# Patient Record
Sex: Female | Born: 1985 | Race: Black or African American | Hispanic: No | Marital: Single | Smoking: Never smoker
Health system: Southern US, Community
[De-identification: ages and names within clinical notes are randomized; demographics above are authoritative.]

## PROBLEM LIST (undated history)

## (undated) DIAGNOSIS — I10 Essential (primary) hypertension: Secondary | ICD-10-CM

## (undated) HISTORY — PX: HERNIA REPAIR: SHX51

---

## 2003-01-30 ENCOUNTER — Inpatient Hospital Stay (HOSPITAL_COMMUNITY): Admission: AD | Admit: 2003-01-30 | Discharge: 2003-01-30 | Payer: Self-pay | Admitting: *Deleted

## 2004-07-04 ENCOUNTER — Ambulatory Visit (HOSPITAL_COMMUNITY): Admission: RE | Admit: 2004-07-04 | Discharge: 2004-07-04 | Payer: Self-pay | Admitting: Obstetrics

## 2004-12-16 ENCOUNTER — Inpatient Hospital Stay (HOSPITAL_COMMUNITY): Admission: AD | Admit: 2004-12-16 | Discharge: 2004-12-19 | Payer: Self-pay | Admitting: Obstetrics

## 2006-02-16 ENCOUNTER — Inpatient Hospital Stay (HOSPITAL_COMMUNITY): Admission: AD | Admit: 2006-02-16 | Discharge: 2006-02-16 | Payer: Self-pay | Admitting: Obstetrics

## 2006-03-08 ENCOUNTER — Ambulatory Visit (HOSPITAL_COMMUNITY): Admission: RE | Admit: 2006-03-08 | Discharge: 2006-03-08 | Payer: Self-pay | Admitting: Obstetrics

## 2006-03-12 ENCOUNTER — Inpatient Hospital Stay (HOSPITAL_COMMUNITY): Admission: AD | Admit: 2006-03-12 | Discharge: 2006-03-12 | Payer: Self-pay | Admitting: Obstetrics

## 2006-03-13 ENCOUNTER — Inpatient Hospital Stay (HOSPITAL_COMMUNITY): Admission: AD | Admit: 2006-03-13 | Discharge: 2006-03-13 | Payer: Self-pay | Admitting: Obstetrics

## 2006-03-14 ENCOUNTER — Inpatient Hospital Stay (HOSPITAL_COMMUNITY): Admission: AD | Admit: 2006-03-14 | Discharge: 2006-03-16 | Payer: Self-pay | Admitting: Obstetrics

## 2006-03-15 ENCOUNTER — Encounter (INDEPENDENT_AMBULATORY_CARE_PROVIDER_SITE_OTHER): Payer: Self-pay | Admitting: *Deleted

## 2007-08-10 ENCOUNTER — Encounter: Payer: Self-pay | Admitting: Family Medicine

## 2007-08-10 ENCOUNTER — Ambulatory Visit: Payer: Self-pay | Admitting: Internal Medicine

## 2007-08-10 LAB — CONVERTED CEMR LAB
ALT: 9 units/L (ref 0–35)
Albumin: 3.7 g/dL (ref 3.5–5.2)
BUN: 9 mg/dL (ref 6–23)
Basophils Absolute: 0 10*3/uL (ref 0.0–0.1)
Basophils Relative: 0 % (ref 0–1)
CO2: 25 meq/L (ref 19–32)
Chloride: 105 meq/L (ref 96–112)
Cholesterol: 180 mg/dL (ref 0–200)
Creatinine, Ser: 0.73 mg/dL (ref 0.40–1.20)
Eosinophils Absolute: 0.2 10*3/uL (ref 0.0–0.7)
Eosinophils Relative: 2 % (ref 0–5)
HDL: 52 mg/dL (ref >39)
Hemoglobin: 9.8 g/dL — ABNORMAL LOW (ref 12.0–15.0)
Lymphocytes Relative: 32 % (ref 12–46)
Lymphs Abs: 2.5 10*3/uL (ref 0.7–4.0)
MCV: 74.8 fL — ABNORMAL LOW (ref 78.0–100.0)
Neutrophils Relative %: 59 % (ref 43–77)
Platelets: 269 10*3/uL (ref 150–400)
Potassium: 4.3 meq/L (ref 3.5–5.3)
TSH: 4.437 microintl units/mL (ref 0.350–4.50)
Total Protein: 7.4 g/dL (ref 6.0–8.3)
Triglycerides: 48 mg/dL (ref <150)
WBC: 7.8 10*3/uL (ref 4.0–10.5)

## 2007-08-29 ENCOUNTER — Ambulatory Visit (HOSPITAL_COMMUNITY): Admission: RE | Admit: 2007-08-29 | Discharge: 2007-08-29 | Payer: Self-pay | Admitting: General Surgery

## 2010-06-17 NOTE — Op Note (Signed)
[name], [name]              ACCOUNT NO.:  [id number]   MEDICAL RECORD NO.:  [id number]          PATIENT TYPE:  AMB   LOCATION:  DAY                          FACILITY:  [facility]   PHYSICIAN:  Leonie Man, M.D.   DATE OF BIRTH:  October 03, 1985   DATE OF PROCEDURE:  08/29/2007  DATE OF DISCHARGE:                               OPERATIVE REPORT   PREOPERATIVE DIAGNOSIS:  Umbilical hernia.   POSTOPERATIVE DIAGNOSIS:  Umbilical hernia.   PROCEDURE:  Repair umbilical hernia.   SURGEON:  Leonie Man, M.D.   ASSISTANT:  OR tech.   ANESTHESIA:  General.   SPECIMENS SENT:  There were no specimens sent to lab.   BLOOD LOSS:  Minimal.   COMPLICATIONS:  None.   DISPOSITION:  The patient returned to the PACU in excellent condition.   INDICATION:  Ms. Valentine is a 25 year old obese female complaining of  severe periumbilical pain.  She has undergone a previous laparoscopy in  the past, and there is, in fact, on palpation a very small defect that  is within the depths of the umbilicus.  Superior to this there is a  bulge, which I felt represented an incarcerated hernia.  The patient  comes to the operating room now after risks and potential benefits of  surgery have been fully discussed, all questions answered and consent  for surgery obtained.   PROCEDURE IN DETAIL:  The patient is positioned supine and following  induction of satisfactory general anesthesia, the patient is identified  as Wendy Mayer.  The operation be done is an umbilical hernia repair.  All 6 precautions in the operating room were then carried out, and then  made an excision that went down into the depths of the umbilicus,  extending superiorly to the region of the bulge, through the skin,  subcutaneous tissue and dissection carried down to the fascia.  There  was, indeed, a very small defect in the umbilical midline.  This was  opened more widely so as to explore the abdominal wall in this area  inferiorly  and superiorly, laterally and distally, there were no  additional defects noted.  The umbilical hernia and the defect created  were then closed with interrupted #1 Novofil sutures.  Sponge,  instrument and sharp counts were verified.  Subcutaneous tissues closed  with 3-0 Vicryl suture running and the skin closed with interrupted 4-0  Monocryl running suture.  This was reinforced with Dermabond and Steri-  Strips.  A sterile dressing applied.  The anesthetic reversed, and the  patient removed from the operating room to the recovery room in stable  condition.  She tolerated the procedure well.      Leonie Man, M.D.  Electronically Signed     PB/MEDQ  D:  08/29/2007  T:  08/29/2007  Job:  [id number]

## 2010-06-20 NOTE — Op Note (Signed)
NAMELOVADA, BARWICK              ACCOUNT NO.:  [id number]   MEDICAL RECORD NO.:  [id number]          PATIENT TYPE:  INP   LOCATION:  9118                          FACILITY:  [facility]   PHYSICIAN:  Kathreen Cosier, M.D.DATE OF BIRTH:  [birth year]   DATE OF PROCEDURE:  03/14/2006  DATE OF DISCHARGE:                               OPERATIVE REPORT   PREOPERATIVE DIAGNOSIS:  Multiparity.   POSTOPERATIVE DIAGNOSIS:  Multiparity.   PROCEDURE:  Postpartum tubal ligation.   DESCRIPTION OF PROCEDURE:  Using spinal, with the patient in the supine  position, the abdomen was prepped and draped.  The bladder was emptied  with a straight catheter.   A midline subumbilical incision 1-inch long was made and carried down to  the fascia.  The fascia was grasped with two Kochers and the fascia of  the peritoneum opened with the Mayo scissors.  The left tube was grasped  in the midportion with a Babcock clamp.  A 0 plain suture was placed in  the mesosalpinx below the portion of tube and the clamp cut and tied and  the 1-inch of tube transected.  Hemostasis was satisfactory.  We  proceeded in a similar fashion on the other side.  The abdomen was  closed in layers, the peritoneum and fascia with continuous suture of 0  Dexon.  The skin was closed with a subcuticular stitch of 4-0 Monocryl.   The patient tolerated the procedure well and was taken to the recovery  room in good condition.           ______________________________  Kathreen Cosier, M.D.     BAM/MEDQ  D:  03/14/2006  T:  03/14/2006  Job:  [id number]

## 2010-10-31 LAB — BASIC METABOLIC PANEL
BUN: 6
CO2: 29
Chloride: 106
Creatinine, Ser: 0.77
GFR calc non Af Amer: >60
Sodium: 143

## 2010-10-31 LAB — DIFFERENTIAL
Basophils Relative: 2 — ABNORMAL HIGH
Eosinophils Absolute: 0.1
Eosinophils Relative: 1
Lymphocytes Relative: 35
Lymphs Abs: 3.8
Monocytes Absolute: 1.1 — ABNORMAL HIGH
Monocytes Relative: 10
Neutro Abs: 5.7
Neutrophils Relative %: 52

## 2010-10-31 LAB — CBC: RBC: 4.42

## 2011-03-03 ENCOUNTER — Encounter (HOSPITAL_COMMUNITY): Payer: Self-pay

## 2011-03-03 ENCOUNTER — Emergency Department (HOSPITAL_COMMUNITY)
Admission: EM | Admit: 2011-03-03 | Discharge: 2011-03-03 | Disposition: A | Discharge status: Home / Self Care | Payer: Medicaid Other | Attending: Emergency Medicine | Admitting: Emergency Medicine

## 2011-03-03 DIAGNOSIS — S61209A Unspecified open wound of unspecified finger without damage to nail, initial encounter: Secondary | ICD-10-CM | POA: Insufficient documentation

## 2011-03-03 DIAGNOSIS — S31109A Unspecified open wound of abdominal wall, unspecified quadrant without penetration into peritoneal cavity, initial encounter: Secondary | ICD-10-CM | POA: Insufficient documentation

## 2011-03-03 DIAGNOSIS — S51809A Unspecified open wound of unspecified forearm, initial encounter: Secondary | ICD-10-CM | POA: Insufficient documentation

## 2011-03-03 DIAGNOSIS — E669 Obesity, unspecified: Secondary | ICD-10-CM | POA: Insufficient documentation

## 2011-03-03 DIAGNOSIS — R109 Unspecified abdominal pain: Secondary | ICD-10-CM | POA: Insufficient documentation

## 2011-03-03 DIAGNOSIS — S31119A Laceration without foreign body of abdominal wall, unspecified quadrant without penetration into peritoneal cavity, initial encounter: Secondary | ICD-10-CM

## 2011-03-03 MED ORDER — TETANUS-DIPHTH-ACELL PERTUSSIS 5-2.5-18.5 LF-MCG/0.5 IM SUSP
0.5000 mL | Freq: Once | INTRAMUSCULAR | Status: AC
  Administered 2011-03-03: 0.5 mL via INTRAMUSCULAR
  Filled 2011-03-03: qty 0.5

## 2011-03-03 NOTE — ED Notes (Signed)
MD at bedside for suture/staple.
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
## 2011-03-26 ENCOUNTER — Emergency Department (HOSPITAL_COMMUNITY)
Admission: EM | Admit: 2011-03-26 | Discharge: 2011-03-26 | Disposition: A | Discharge status: Home / Self Care | Payer: Medicaid Other | Attending: Emergency Medicine | Admitting: Emergency Medicine

## 2011-03-26 ENCOUNTER — Encounter (HOSPITAL_COMMUNITY): Payer: Self-pay | Admitting: Emergency Medicine

## 2011-03-26 DIAGNOSIS — H9209 Otalgia, unspecified ear: Secondary | ICD-10-CM | POA: Insufficient documentation

## 2011-03-26 DIAGNOSIS — H9201 Otalgia, right ear: Secondary | ICD-10-CM

## 2011-03-26 DIAGNOSIS — J309 Allergic rhinitis, unspecified: Secondary | ICD-10-CM | POA: Insufficient documentation

## 2011-03-26 HISTORY — DX: Essential (primary) hypertension: I10

## 2011-03-26 MED ORDER — ANTIPYRINE-BENZOCAINE 5.4-1.4 % OT SOLN
3.0000 [drp] | Freq: Once | OTIC | Status: AC
  Administered 2011-03-26: 4 [drp] via OTIC
  Filled 2011-03-26: qty 10

## 2011-03-26 MED ORDER — OXYMETAZOLINE HCL 0.05 % NA SOLN: 2.0000 | Freq: Two times a day (BID) | NASAL | Status: AC

## 2011-03-26 MED ORDER — FLUTICASONE PROPIONATE 50 MCG/ACT NA SUSP: 2.0000 | Freq: Every day | NASAL | Status: DC

## 2011-03-26 NOTE — ED Provider Notes (Signed)
History     CSN: [id number]  Arrival date & time 03/26/11  1017   First MD Initiated Contact with Patient 03/26/11 1027      No chief complaint on file.   (Consider location/radiation/quality/duration/timing/severity/associated sxs/prior treatment) The history is provided by the patient.   Patient states that she has had right ear pain for the past month, which has been accompanied by headache. This is described as achy in nature, and has not seemed to change in nature since initial presentation. Headache is worst in the morning and seems to improve throughout the day. She denies difficulty hearing from the ear, tinnitus. States she has slight dizziness when the headache is severe. She has not had associated chest pain, dyspnea, abdominal pain, nausea, vomiting. There's been no photophobia or phonophobia. Denies changes in vision. She does have a history of allergic rhinitis.  No past medical history on file.  Past Surgical History  Procedure Date  . Hernia repair     No family history on file.  History  Substance Use Topics  . Smoking status: Never Smoker   . Smokeless tobacco: Not on file  . Alcohol Use: No    OB History    Grav Para Term Preterm Abortions TAB SAB Ect Mult Living                  Review of Systems  Constitutional: Negative for fever, chills, activity change and appetite change.   review of systems negative except as indicated in the history of present illness.  Allergies  Review of patient's allergies indicates no known allergies.  Home Medications  No current outpatient prescriptions on file.  BP 127/64  Pulse 77  Temp 98.6 F (37 C)  Resp 18  SpO2 100%  LMP 03/20/2011  Physical Exam  Nursing note and vitals reviewed. Constitutional: She is oriented to person, place, and time. She appears well-developed and well-nourished. No distress.  HENT:  Head: Normocephalic and atraumatic.  Right Ear: External ear normal.  Left Ear: External ear  normal.       Tympanic membranes normal bilaterally. There is no evidence for otitis externa. She is nontender to palpation of the mastoids. There is no sinus tenderness to palpation or percussion. There is no evidence of dental decay or tenderness to palpation of the teeth.  Eyes: Conjunctivae and EOM are normal. Pupils are equal, round, and reactive to light. Right eye exhibits no discharge. Left eye exhibits no discharge.       Visual Acuity  -  Bilateral Near:  20/20 ;  Bilateral Distance:  20/20 ;  R Near:  20/20 ;  R Distance:  20/20 ;  L Near:  20/20 ;  L Distance:  20/20  Neck: Normal range of motion. Neck supple.  Cardiovascular: Normal rate, regular rhythm and normal heart sounds.   Pulmonary/Chest: Effort normal and breath sounds normal. She exhibits no tenderness.  Abdominal: Soft. Bowel sounds are normal. There is no tenderness. There is no rebound and no guarding.  Musculoskeletal: Normal range of motion.  Lymphadenopathy:    She has no cervical adenopathy.  Neurological: She is alert and oriented to person, place, and time.  Skin: Skin is warm and dry. No rash noted. She is not diaphoretic.  Psychiatric: She has a normal mood and affect.    ED Course  Procedures (including critical care time)  Labs Reviewed - No data to display No results found.   1. Otalgia of right ear  2. Allergic rhinitis       MDM  Patient with otalgia of the right ear. She is nontoxic-appearing and afebrile. There are no findings on physical exam to account for her otalgia. Plan to treat her symptomatically. Will give a trial of Afrin for possible eustachian tube dysfunction. She was encouraged to make a followup with ENT if she's not improving. Patient was agreeable to this plan. Return precautions discussed.        [place], [place] 03/27/11 [phone]

## 2011-03-26 NOTE — Discharge Instructions (Signed)
Your exam today did not show an obvious cause for your pain. You have been given ear drops here to use - place several drops in the ear 3 times daily as needed. You've also been given prescriptions for 2 nose sprays: Afrin (decongestant) and Flonase (for allergies). Do not use the Afrin for more than 5 days in a row. If you're having worsening pain, high fever, or any other worrisome symptoms, please return to the ER.  RESOURCE GUIDE  Dental Problems  Patients with Medicaid: [facility] [phone] [address]                                           [phone] [address] Phone:  [phone]                                                  Phone:  [phone]  If unable to pay or uninsured, contact:  [facility] or [facility]. to become qualified for the adult dental clinic.  Chronic Pain Problems Contact [facility]  [phone] Patients need to be referred by their primary care doctor.  Insufficient Money for Medicine Contact [employer]:  call "211" or [facility] 630-670-8587.  No Primary Care Doctor Call [facility]  [phone] Other agencies that provide inexpensive medical care    [facility]  [phone]    [facility]  [phone]    [facility]  [phone]    [facility]  [phone]    [facility]  [phone]    [facility]  [phone]  Psychological Services [facility]  [phone] [facility]  [phone] [facility]   [phone] (emergency services [phone])  Substance Abuse Resources [facility]  [phone] [facility] [phone] [facility] [phone] Floydene Flock [phone] Residential & Outpatient Substance Abuse Program  782-820-9242  Abuse/Neglect [facility] Child Abuse Hotline 218-715-7897 [facility] Child Abuse Hotline [phone]  (After Hours)  Emergency Shelter [facility] [phone]  Maternity Homes Room at the [place] [facility] [phone] [facility] [phone]  MRSA Hotline #:   (808)786-9468    [facility] Resources  [facility]     [employer]                          [facility] [address] [place]                       [address]      [address]  Patrecia Pace  Michell Heinrich Phone:  [phone]                                   Phone:  [phone]                 Phone:  [phone]  [facility] Phone:  [phone]  [facility] Child Abuse Hotline 2537618953 [phone] (After Hours)  Otalgia The most common reason for this in children is an infection of the middle ear. Pain from the middle ear is usually caused by a build-up of fluid and pressure behind the eardrum. Pain from an earache can be sharp, dull, or burning. The pain may be temporary or constant. The middle ear is connected to the nasal passages by a short narrow tube called the Eustachian tube. The Eustachian tube allows fluid to drain out of the middle ear, and helps keep the pressure in your ear equalized. CAUSES  A cold or allergy can block the Eustachian tube with inflammation and the build-up of secretions. This is especially likely in small children, because their Eustachian tube is shorter and more horizontal. When the Eustachian tube closes, the normal flow of fluid from the middle ear is stopped. Fluid can accumulate and cause stuffiness, pain, hearing loss, and an ear infection if germs start growing in this area. SYMPTOMS  The symptoms of an ear infection may include fever, ear pain, fussiness, increased crying, and irritability. Many children will have temporary and minor hearing loss during and right after an ear infection.  Permanent hearing loss is rare, but the risk increases the more infections a child has. Other causes of ear pain include retained water in the outer ear canal from swimming and bathing. Ear pain in adults is less likely to be from an ear infection. Ear pain may be referred from other locations. Referred pain may be from the joint between your jaw and the skull. It may also come from a tooth problem or problems in the neck. Other causes of ear pain include:  A foreign body in the ear.   Outer ear infection.   Sinus infections.   Impacted ear wax.   Ear injury.   Arthritis of the jaw or TMJ problems.   Middle ear infection.   Tooth infections.   Sore throat with pain to the ears.  DIAGNOSIS  Your caregiver can usually make the diagnosis by examining you. Sometimes other special studies, including x-rays and lab work may be necessary. TREATMENT   If antibiotics were prescribed, use them as directed and finish them even if you or your child's symptoms seem to be improved.   Sometimes PE tubes are needed in children. These are little plastic tubes which are put into the eardrum during a simple surgical procedure. They allow fluid to drain easier and allow the pressure in the middle ear to equalize. This helps relieve the ear pain caused by pressure changes.  HOME CARE INSTRUCTIONS   Only take over-the-counter or prescription medicines for pain, discomfort, or fever as directed by your caregiver. DO NOT GIVE CHILDREN ASPIRIN because of the association of Reye's Syndrome in children taking aspirin.   Use a cold pack applied to the outer ear for 15 to 20 minutes, 3 to 4 times per day or as needed may reduce pain. Do not apply ice directly to the skin. You may cause frost bite.   Over-the-counter ear drops used as  directed may be effective. Your caregiver may sometimes prescribe ear drops.   Resting in an upright position may help reduce pressure in the middle ear and relieve pain.   Ear  pain caused by rapidly descending from high altitudes can be relieved by swallowing or chewing gum. Allowing infants to suck on a bottle during airplane travel can help.   Do not smoke in the house or near children. If you are unable to quit smoking, smoke outside.   Control allergies.  SEEK IMMEDIATE MEDICAL CARE IF:   You or your child are becoming sicker.   Pain or fever relief is not obtained with medicine.   You or your child's symptoms (pain, fever, or irritability) do not improve within 24 to 48 hours or as instructed.   Severe pain suddenly stops hurting. This may indicate a ruptured eardrum.   You or your children develop new problems such as severe headaches, stiff neck, difficulty swallowing, or swelling of the face or around the ear.  Document Released: 09/06/2003 Document Revised: 10/01/2010 Document Reviewed: 01/11/2008 [facility] Patient Information 2012 [place], [place].Headache and Allergies The relationship between allergies and headaches is unclear. Many people with allergic or infectious nasal problems also have headaches (migraines or sinus headaches). However, sometimes allergies can cause pressure that feels like a headache, and sometimes headaches can cause allergy-like symptoms. It is not always clear whether your symptoms are caused by allergies or by a headache. CAUSES   Migraine: The cause of a migraine is not always known.   Sinus Headache: The cause of a sinus headache may be a sinus infection. Other conditions that may be related to sinus headaches include:   Hay fever (allergic rhinitis).   Deviation of the nasal septum.   Swelling or clogging of the nasal passages.  SYMPTOMS  Migraine headache symptoms (which often last 4 to 72 hours) include:  Intense, throbbing pain on one or both sides of the head.   Nausea.   Vomiting.   Being extra sensitive to light.   Being extra sensitive to sound.   Nervous system reactions that appear similar to an  allergic reaction:   Stuffy nose.   Runny nose.   Tearing.  Sinus headaches are felt as facial pain or pressure.  DIAGNOSIS  Because there is some overlap in symptoms, sinus and migraine headaches are often misdiagnosed. For example, a person with migraines may also feel facial pressure. Likewise, many people with hay fever may get migraine headaches rather than sinus headaches. These migraines can be triggered by the histamine release during an allergic reaction. An antihistamine medicine can eliminate this pain. There are standard criteria that help clarify the difference between these headaches and related allergy or allergy-like symptoms. Your caregiver can use these criteria to determine the proper diagnosis and provide you the best care. TREATMENT  Migraine medicine may help people who have persistent migraine headaches even though their hay fever is controlled. For some people, anti-inflammatory treatments do not work to relieve migraines. Medicines called triptans (such as sumatriptan) can be helpful for those people. Document Released: 04/11/2003 Document Revised: 10/01/2010 Document Reviewed: 05/03/2009 [facility] Patient Information 2012 [place], [place].

## 2011-03-27 NOTE — ED Provider Notes (Signed)
Medical screening examination/treatment/procedure(s) were performed by non-physician practitioner and as supervising physician I was immediately available for consultation/collaboration.   Lyanne Co, MD 03/27/11 [phone]

## 2011-04-30 ENCOUNTER — Encounter (HOSPITAL_COMMUNITY): Payer: Self-pay | Admitting: Physical Medicine and Rehabilitation

## 2011-04-30 ENCOUNTER — Emergency Department (HOSPITAL_COMMUNITY)
Admission: EM | Admit: 2011-04-30 | Discharge: 2011-04-30 | Disposition: A | Discharge status: Home / Self Care | Payer: Medicaid Other | Attending: Emergency Medicine | Admitting: Emergency Medicine

## 2011-04-30 DIAGNOSIS — H109 Unspecified conjunctivitis: Secondary | ICD-10-CM | POA: Insufficient documentation

## 2011-04-30 DIAGNOSIS — I10 Essential (primary) hypertension: Secondary | ICD-10-CM | POA: Insufficient documentation

## 2011-04-30 MED ORDER — TETRACAINE HCL 0.5 % OP SOLN
OPHTHALMIC | Status: AC
  Administered 2011-04-30: 11:00:00
  Filled 2011-04-30: qty 2

## 2011-04-30 MED ORDER — ERYTHROMYCIN 5 MG/GM OP OINT: TOPICAL_OINTMENT | OPHTHALMIC | Status: AC

## 2011-04-30 MED ORDER — FLUORESCEIN SODIUM 1 MG OP STRP
ORAL_STRIP | OPHTHALMIC | Status: AC
  Administered 2011-04-30: 11:00:00
  Filled 2011-04-30: qty 1

## 2011-04-30 MED ORDER — ERYTHROMYCIN 5 MG/GM OP OINT
TOPICAL_OINTMENT | Freq: Once | OPHTHALMIC | Status: AC
  Administered 2011-04-30: 1 via OPHTHALMIC
  Filled 2011-04-30: qty 1

## 2011-04-30 NOTE — ED Provider Notes (Signed)
History     CSN: [id number]  Arrival date & time 04/30/11  1017   First MD Initiated Contact with Patient 04/30/11 1035      Chief Complaint  Patient presents with  . Conjunctivitis    (Consider location/radiation/quality/duration/timing/severity/associated sxs/prior treatment) HPI CC left eye pain, itching redness started yesterday, mild to moderate.  Son with same sx's.  No injury.  No vision changes. Past Medical History  Diagnosis Date  . Hypertension     Past Surgical History  Procedure Date  . Hernia repair     Family History  Problem Relation Age of Onset  . Hypertension Mother     History  Substance Use Topics  . Smoking status: Never Smoker   . Smokeless tobacco: Not on file  . Alcohol Use: No    OB History    Grav Para Term Preterm Abortions TAB SAB Ect Mult Living                  Review of Systems  Eyes: Positive for pain, redness and itching. Negative for photophobia, discharge and visual disturbance.  All other systems reviewed and are negative.    Allergies  Review of patient's allergies indicates no known allergies.  Home Medications   Current Outpatient Rx  Name Route Sig Dispense Refill  . FLUTICASONE PROPIONATE 50 MCG/ACT NA SUSP Nasal Place 2 sprays into the nose daily. 16 g 2  . ERYTHROMYCIN 5 MG/GM OP OINT  Place a 1/2 inch ribbon of ointment into the lower eyelid four times a day until resolved. 3.5 g 0    BP 119/65  Pulse 82  Temp(Src) 98.5 F (36.9 C) (Oral)  Resp 20  SpO2 99%ra wnl  Physical Exam  Nursing note and vitals reviewed. Constitutional: She appears well-developed and well-nourished.  HENT:  Head: Normocephalic and atraumatic.  Eyes: Right eye exhibits no discharge. Left eye exhibits no discharge. Left conjunctiva is injected.  Slit lamp exam:      The left eye shows no corneal abrasion, no corneal flare, no corneal ulcer, no foreign body, no hyphema, no hypopyon and no fluorescein uptake.  Neurological:  She is alert. GCS eye subscore is 4. GCS verbal subscore is 5. GCS motor subscore is 6.  Skin: Skin is warm and dry.  Psychiatric: She has a normal mood and affect. Her behavior is normal.    ED Course  Procedures (including critical care time)  Labs Reviewed - No data to display No results found.   1. Conjunctivitis       MDM  Pt here with mild redness and irritation of left eye, son has bilat conjunctivitis.  Fluorescein stain neg, slit lamp nl, visual acuity ok.  Will d/c with erythromycin ointment, return warnings given        Elijio Miles, MD 04/30/11 1153

## 2011-04-30 NOTE — ED Notes (Signed)
Pt presents to department for evaluation of L eye redness and irritation. Onset this morning. States yellow colored drainage. States son recently had conjunctivitis. Eye noted to be red and watery upon arrival to ED.

## 2011-04-30 NOTE — Discharge Instructions (Signed)
Conjunctivitis Conjunctivitis is commonly called "pink eye." Conjunctivitis can be caused by bacterial or viral infection, allergies, or injuries. There is usually redness of the lining of the eye, itching, discomfort, and sometimes discharge. There may be deposits of matter along the eyelids. A viral infection usually causes a watery discharge, while a bacterial infection causes a yellowish, thick discharge. Pink eye is very contagious and spreads by direct contact. You may be given antibiotic eyedrops as part of your treatment. Before using your eye medicine, remove all drainage from the eye by washing gently with warm water and cotton balls. Continue to use the medication until you have awakened 2 mornings in a row without discharge from the eye. Do not rub your eye. This increases the irritation and helps spread infection. Use separate towels from other household members. Wash your hands with soap and water before and after touching your eyes. Use cold compresses to reduce pain and sunglasses to relieve irritation from light. Do not wear contact lenses or wear eye makeup until the infection is gone. SEEK MEDICAL CARE IF:   Your symptoms are not better after 3 days of treatment.   You have increased pain or trouble seeing.   The outer eyelids become very red or swollen.  Document Released: 02/27/2004 Document Revised: 01/08/2011 Document Reviewed: 01/19/2005 Exit[employer] Patient Information 2012 [employer].  Return for any new or worsening symptoms or any other concerns.  

## 2011-04-30 NOTE — ED Provider Notes (Signed)
I saw and evaluated the patient, reviewed the resident's note and I agree with the findings and plan.  L eye itching and redness x 2 days.  No visual change.  Son has conjuncitivits.  PERRLA, EOMI, mild conjunctival injection.  Glynn Octave, MD 04/30/11 [phone]

## 2012-08-16 ENCOUNTER — Ambulatory Visit (INDEPENDENT_AMBULATORY_CARE_PROVIDER_SITE_OTHER): Payer: Medicaid Other | Admitting: Obstetrics

## 2012-08-16 ENCOUNTER — Encounter: Payer: Self-pay | Admitting: Obstetrics

## 2012-08-16 VITALS — BP 129/86 | HR 73 | Ht 66.0 in | Wt 302.0 lb

## 2012-08-16 DIAGNOSIS — Z0000 Encounter for general adult medical examination without abnormal findings: Secondary | ICD-10-CM

## 2012-08-16 NOTE — Progress Notes (Signed)
Subjective:     Wendy Mayer is a 27 y.o. female here for a routine exam.  Current complaints: an annual exam. Pt states she has been having daily headaches. Pt states she has been taking Goody powder and it helps.   Personal health questionnaire reviewed: yes.   Gynecologic History Patient's last menstrual period was 08/04/2012. Contraception: tubal ligation Last Pap: 2012. Results were: normal Last mammogram: n/a. Results were: n/a  Obstetric History OB History   Grav Para Term Preterm Abortions TAB SAB Ect Mult Living                   The following portions of the patient's history were reviewed and updated as appropriate: allergies, current medications, past family history, past medical history, past social history, past surgical history and problem list.  Review of Systems Pertinent items are noted in HPI.    Objective:    General appearance: alert, no distress and morbidly obese Breasts: normal appearance, no masses or tenderness Abdomen: normal findings: soft, non-tender Pelvic: cervix normal in appearance, external genitalia normal, no adnexal masses or tenderness, no cervical motion tenderness, uterus normal size, shape, and consistency and vagina normal without discharge    Assessment:    Healthy female exam.    Plan:    Education reviewed: low fat, low cholesterol diet, safe sex/STD prevention, self breast exams and weight bearing exercise. Contraception: tubal ligation. Follow up in: 1 year.

## 2012-08-16 NOTE — Addendum Note (Signed)
Addended by: George Hugh on: 08/16/2012 04:06 PM   Modules accepted: Orders

## 2012-08-17 LAB — WET PREP BY MOLECULAR PROBE: Trichomonas vaginosis: NEGATIVE

## 2012-08-17 LAB — GC/CHLAMYDIA PROBE AMP: CT Probe RNA: NEGATIVE

## 2012-08-17 LAB — PAP IG W/ RFLX HPV ASCU

## 2012-08-19 ENCOUNTER — Other Ambulatory Visit: Payer: Self-pay | Admitting: Obstetrics

## 2012-08-19 MED ORDER — METRONIDAZOLE 500 MG PO TABS: 500.0000 mg | ORAL_TABLET | Freq: Two times a day (BID) | ORAL | Status: DC

## 2012-11-27 ENCOUNTER — Encounter (HOSPITAL_COMMUNITY): Payer: Self-pay | Admitting: Emergency Medicine

## 2012-11-27 ENCOUNTER — Emergency Department (HOSPITAL_COMMUNITY)
Admission: EM | Admit: 2012-11-27 | Discharge: 2012-11-27 | Disposition: A | Discharge status: Home / Self Care | Payer: Medicaid Other | Attending: Emergency Medicine | Admitting: Emergency Medicine

## 2012-11-27 DIAGNOSIS — L853 Xerosis cutis: Secondary | ICD-10-CM

## 2012-11-27 DIAGNOSIS — I10 Essential (primary) hypertension: Secondary | ICD-10-CM | POA: Insufficient documentation

## 2012-11-27 DIAGNOSIS — B352 Tinea manuum: Secondary | ICD-10-CM

## 2012-11-27 DIAGNOSIS — L989 Disorder of the skin and subcutaneous tissue, unspecified: Secondary | ICD-10-CM | POA: Insufficient documentation

## 2012-11-27 MED ORDER — TERBINAFINE HCL 1 % EX CREA: TOPICAL_CREAM | Freq: Two times a day (BID) | CUTANEOUS | Status: DC

## 2012-11-27 NOTE — ED Provider Notes (Signed)
CSN: [id number]     Arrival date & time 11/27/12  2019 History   None    This chart was scribed for non-physician practitioner, Ivonne Andrew PA-C,  working with Toy Baker, MD by Arlan Organ, ED Scribe. This patient was seen in room WTR7/WTR7 and the patient's care was started at 10:03 PM.   Chief Complaint  Patient presents with  . Rash   The history is provided by the patient. No language interpreter was used.   HPI Comments: Wendy Mayer is a 27 y.o. female who presents to the Emergency Department complaining of a unchanged bilateral peeling rash to bilateral hands that started a week ago. Pt states she has tried vaseline and lotion with no relief. Pt denies touching any new detergents, soups, or chemicals. Pt denies any known skin conditions. Pt has a hx of HTN but no other medical study. She denies any other symptoms. No other rash of the skin no breathing changes. No urinary changes. No menstrual changes. No weight loss or night sweats. No dry mouth or dry eyes.    Past Medical History  Diagnosis Date  . Hypertension    Past Surgical History  Procedure Laterality Date  . Hernia repair     Family History  Problem Relation Age of Onset  . Hypertension Mother   . Cancer Father    History  Substance Use Topics  . Smoking status: Never Smoker   . Smokeless tobacco: Not on file  . Alcohol Use: No   OB History   Grav Para Term Preterm Abortions TAB SAB Ect Mult Living                 Review of Systems  Skin: Positive for rash.  All other systems reviewed and are negative.    Allergies  Review of patient's allergies indicates no known allergies.  Home Medications  No current outpatient prescriptions on file.  BP 134/79  Pulse 86  Temp(Src) 98.4 F (36.9 C) (Oral)  Resp 18  Ht 5\' 7"  (1.702 m)  Wt 289 lb (131.09 kg)  BMI 45.25 kg/m2  SpO2 100%  Physical Exam  Nursing note and vitals reviewed. Constitutional: She is oriented to person, place, and  time. She appears well-developed and well-nourished.  HENT:  Head: Normocephalic and atraumatic.  Eyes: EOM are normal.  Neck: Normal range of motion.  Cardiovascular: Normal rate.   Pulmonary/Chest: Effort normal.  Musculoskeletal: Normal range of motion.  Neurological: She is alert and oriented to person, place, and time.  Skin: Skin is warm and dry.  Dry and peeling skin to web spacing of hands  Psychiatric: She has a normal mood and affect. Her behavior is normal.    ED Course  Procedures   DIAGNOSTIC STUDIES: Oxygen Saturation is 100% on RA, Normal by my interpretation.    COORDINATION OF CARE: 10:03 PM- Will give topical medication. Discussed treatment plan with pt at bedside and pt agreed to plan.       MDM   1. Dry skin   2. Tinea manus      I personally performed the services described in this documentation, which was scribed in my presence. The recorded information has been reviewed and is accurate.   Angus Seller, PA-C 11/28/12 [phone]

## 2012-11-27 NOTE — ED Notes (Signed)
Pt c/o peeling to bilateral hands for one week.

## 2012-11-30 NOTE — ED Provider Notes (Signed)
Medical screening examination/treatment/procedure(s) were performed by non-physician practitioner and as supervising physician I was immediately available for consultation/collaboration.   Marik Sedore T Mayrani Khamis, MD 11/30/12 1332 

## 2012-12-28 ENCOUNTER — Emergency Department (HOSPITAL_COMMUNITY): Payer: Medicaid Other

## 2012-12-28 ENCOUNTER — Encounter (HOSPITAL_COMMUNITY): Payer: Self-pay | Admitting: Emergency Medicine

## 2012-12-28 ENCOUNTER — Emergency Department (HOSPITAL_COMMUNITY)
Admission: EM | Admit: 2012-12-28 | Discharge: 2012-12-28 | Disposition: A | Discharge status: Home / Self Care | Payer: Medicaid Other | Attending: Emergency Medicine | Admitting: Emergency Medicine

## 2012-12-28 DIAGNOSIS — S01502A Unspecified open wound of oral cavity, initial encounter: Secondary | ICD-10-CM | POA: Insufficient documentation

## 2012-12-28 DIAGNOSIS — S022XXA Fracture of nasal bones, initial encounter for closed fracture: Secondary | ICD-10-CM | POA: Insufficient documentation

## 2012-12-28 DIAGNOSIS — S0990XA Unspecified injury of head, initial encounter: Secondary | ICD-10-CM | POA: Insufficient documentation

## 2012-12-28 DIAGNOSIS — I10 Essential (primary) hypertension: Secondary | ICD-10-CM | POA: Insufficient documentation

## 2012-12-28 DIAGNOSIS — S0120XA Unspecified open wound of nose, initial encounter: Secondary | ICD-10-CM | POA: Insufficient documentation

## 2012-12-28 DIAGNOSIS — Z23 Encounter for immunization: Secondary | ICD-10-CM | POA: Insufficient documentation

## 2012-12-28 MED ORDER — TETANUS-DIPHTH-ACELL PERTUSSIS 5-2.5-18.5 LF-MCG/0.5 IM SUSP
0.5000 mL | Freq: Once | INTRAMUSCULAR | Status: AC
  Administered 2012-12-28: 0.5 mL via INTRAMUSCULAR
  Filled 2012-12-28: qty 0.5

## 2012-12-28 MED ORDER — IBUPROFEN 800 MG PO TABS
800.0000 mg | ORAL_TABLET | Freq: Once | ORAL | Status: AC
  Administered 2012-12-28: 800 mg via ORAL
  Filled 2012-12-28: qty 1

## 2012-12-28 NOTE — ED Provider Notes (Signed)
Medical screening examination/treatment/procedure(s) were performed by non-physician practitioner and as supervising physician I was immediately available for consultation/collaboration.  EKG Interpretation   None         Candyce Churn, MD 12/28/12 2033
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
## 2012-12-28 NOTE — ED Notes (Signed)
[employer] at bedside 

## 2013-07-21 ENCOUNTER — Encounter (HOSPITAL_COMMUNITY): Payer: Self-pay | Admitting: Emergency Medicine

## 2013-07-21 ENCOUNTER — Emergency Department (HOSPITAL_COMMUNITY)
Admission: EM | Admit: 2013-07-21 | Discharge: 2013-07-21 | Disposition: A | Discharge status: Home / Self Care | Payer: Medicaid Other | Attending: Emergency Medicine | Admitting: Emergency Medicine

## 2013-07-21 DIAGNOSIS — I10 Essential (primary) hypertension: Secondary | ICD-10-CM | POA: Insufficient documentation

## 2013-07-21 DIAGNOSIS — H9209 Otalgia, unspecified ear: Secondary | ICD-10-CM | POA: Insufficient documentation

## 2013-07-21 DIAGNOSIS — J069 Acute upper respiratory infection, unspecified: Secondary | ICD-10-CM

## 2013-07-21 LAB — RAPID STREP SCREEN (MED CTR MEBANE ONLY): STREPTOCOCCUS, GROUP A SCREEN (DIRECT): NEGATIVE

## 2013-07-21 MED ORDER — GUAIFENESIN 100 MG/5ML PO LIQD: 100.0000 mg | ORAL | Status: DC | PRN

## 2013-07-21 MED ORDER — MENTHOL 5.4 MG MT LOZG
1.0000 | LOZENGE | Freq: Four times a day (QID) | OROMUCOSAL | Status: DC | PRN
Start: 2013-07-21 — End: 2013-08-16

## 2013-07-21 NOTE — Discharge Instructions (Signed)
Upper Respiratory Infection, Adult An upper respiratory infection (URI) is also sometimes known as the common cold. The upper respiratory tract includes the nose, sinuses, throat, trachea, and bronchi. Bronchi are the airways leading to the lungs. Most people improve within 1 week, but symptoms can last up to 2 weeks. A residual cough may last even longer.  CAUSES Many different viruses can infect the tissues lining the upper respiratory tract. The tissues become irritated and inflamed and often become very moist. Mucus production is also common. A cold is contagious. You can easily spread the virus to others by oral contact. This includes kissing, sharing a glass, coughing, or sneezing. Touching your mouth or nose and then touching a surface, which is then touched by another person, can also spread the virus. SYMPTOMS  Symptoms typically develop 1 to 3 days after you come in contact with a cold virus. Symptoms vary from person to person. They may include:  Runny nose.  Sneezing.  Nasal congestion.  Sinus irritation.  Sore throat.  Loss of voice (laryngitis).  Cough.  Fatigue.  Muscle aches.  Loss of appetite.  Headache.  Low-grade fever. DIAGNOSIS  You might diagnose your own cold based on familiar symptoms, since most people get a cold 2 to 3 times a year. Your caregiver can confirm this based on your exam. Most importantly, your caregiver can check that your symptoms are not due to another disease such as strep throat, sinusitis, pneumonia, asthma, or epiglottitis. Blood tests, throat tests, and X-rays are not necessary to diagnose a common cold, but they may sometimes be helpful in excluding other more serious diseases. Your caregiver will decide if any further tests are required. RISKS AND COMPLICATIONS  You may be at risk for a more severe case of the common cold if you smoke cigarettes, have chronic heart disease (such as heart failure) or lung disease (such as asthma), or if  you have a weakened immune system. The very young and very old are also at risk for more serious infections. Bacterial sinusitis, middle ear infections, and bacterial pneumonia can complicate the common cold. The common cold can worsen asthma and chronic obstructive pulmonary disease (COPD). Sometimes, these complications can require emergency medical care and may be life-threatening. PREVENTION  The best way to protect against getting a cold is to practice good hygiene. Avoid oral or hand contact with people with cold symptoms. Wash your hands often if contact occurs. There is no clear evidence that vitamin C, vitamin E, echinacea, or exercise reduces the chance of developing a cold. However, it is always recommended to get plenty of rest and practice good nutrition. TREATMENT  Treatment is directed at relieving symptoms. There is no cure. Antibiotics are not effective, because the infection is caused by a virus, not by bacteria. Treatment may include:  Increased fluid intake. Sports drinks offer valuable electrolytes, sugars, and fluids.  Breathing heated mist or steam (vaporizer or shower).  Eating chicken soup or other clear broths, and maintaining good nutrition.  Getting plenty of rest.  Using gargles or lozenges for comfort.  Controlling fevers with ibuprofen or acetaminophen as directed by your caregiver.  Increasing usage of your inhaler if you have asthma. Zinc gel and zinc lozenges, taken in the first 24 hours of the common cold, can shorten the duration and lessen the severity of symptoms. Pain medicines may help with fever, muscle aches, and throat pain. A variety of non-prescription medicines are available to treat congestion and runny nose. Your caregiver   can make recommendations and may suggest nasal or lung inhalers for other symptoms.  HOME CARE INSTRUCTIONS   Only take over-the-counter or prescription medicines for pain, discomfort, or fever as directed by your  caregiver.  Use a warm mist humidifier or inhale steam from a shower to increase air moisture. This may keep secretions moist and make it easier to breathe.  Drink enough water and fluids to keep your urine clear or pale yellow.  Rest as needed.  Return to work when your temperature has returned to normal or as your caregiver advises. You may need to stay home longer to avoid infecting others. You can also use a face mask and careful hand washing to prevent spread of the virus. SEEK MEDICAL CARE IF:   After the first few days, you feel you are getting worse rather than better.  You need your caregiver's advice about medicines to control symptoms.  You develop chills, worsening shortness of breath, or brown or red sputum. These may be signs of pneumonia.  You develop yellow or brown nasal discharge or pain in the face, especially when you bend forward. These may be signs of sinusitis.  You develop a fever, swollen neck glands, pain with swallowing, or white areas in the back of your throat. These may be signs of strep throat. SEEK IMMEDIATE MEDICAL CARE IF:   You have a fever.  You develop severe or persistent headache, ear pain, sinus pain, or chest pain.  You develop wheezing, a prolonged cough, cough up blood, or have a change in your usual mucus (if you have chronic lung disease).  You develop sore muscles or a stiff neck. Document Released: 07/15/2000 Document Revised: 04/13/2011 Document Reviewed: 05/23/2010 [employer] Patient Information 2015 [employer]. This information is not intended to replace advice given to you by your health care provider. Make sure you discuss any questions you have with your health care provider.  

## 2013-07-21 NOTE — ED Notes (Signed)
Pt c/o sore throat x 2 days. Pt states she is having painful swallowing . Pt alert, no acute distress. Denies other symptoms.

## 2013-07-21 NOTE — ED Provider Notes (Signed)
CSN: [id number]     Arrival date & time 07/21/13  1633 History  This chart was scribed for Fayrene HelperBowie Tran PA- C, working with Hilario Quarryanielle S Ray, MD, by Gwenevere AbbotAlexis Brown ED Scribe. This patient was seen in room WTR9/WTR9 and the patient's care was started at 5:15PM.   Chief Complaint  Patient presents with  . Sore Throat   The history is provided by the patient. No language interpreter was used.   HPI Comments: Wendy Mayer is a 28 y.o. female who presents to the Emergency Department complaining of a sore throat for the last two days. Pain is constant and moderate, and made worse by swallowing. Pt reports associated symptoms of ear pain, congestion, cough, rhinorrhea, and sneezing. She also states that she had one episode of emesis yesterday. She further reports an associated fever of 102 degrees on Wednesday.Pt has taken Therflu and Nyquil but this has not modified pain. Pt denies any sick contacts. Pt denies any allergies to medication. Pt denies CP, SOB, abdominal pain, and diarrhea.   Past Medical History  Diagnosis Date  . Hypertension    Past Surgical History  Procedure Laterality Date  . Hernia repair     Family History  Problem Relation Age of Onset  . Hypertension Mother   . Cancer Father    History  Substance Use Topics  . Smoking status: Never Smoker   . Smokeless tobacco: Not on file  . Alcohol Use: No   OB History   Grav Para Term Preterm Abortions TAB SAB Ect Mult Living                 Review of Systems  Constitutional: Positive for fever.  HENT: Positive for congestion, rhinorrhea and sore throat.   Respiratory: Positive for cough.   Gastrointestinal: Negative for nausea and diarrhea.    Allergies  Review of patient's allergies indicates no known allergies.  Home Medications   Prior to Admission medications   Not on File   Triage Vitals: BP 149/83  Pulse 98  Temp(Src) 98.4 F (36.9 C) (Oral)  Resp 18  SpO2 98%  Physical Exam  Nursing note and vitals  reviewed. Constitutional: She is oriented to person, place, and time. She appears well-developed and well-nourished. No distress.  HENT:  Head: Normocephalic and atraumatic.  Right Ear: Tympanic membrane, external ear and ear canal normal.  Left Ear: Tympanic membrane, external ear and ear canal normal.  Rhinorrhea noted. Uvula midline. Bilateral tonsil enlargement, with exudate. No trismus. No evidence of deep tissue infection  Eyes: Conjunctivae and EOM are normal.  Neck: Neck supple. No tracheal deviation present.  Anterior cervical chain adenopathy noted  Cardiovascular: Normal rate, regular rhythm and normal heart sounds.   Pulmonary/Chest: Effort normal. No respiratory distress.  Musculoskeletal: Normal range of motion.  Lymphadenopathy:    She has cervical adenopathy.  Neurological: She is alert and oriented to person, place, and time.  Skin: Skin is warm and dry.  Psychiatric: She has a normal mood and affect. Her behavior is normal.    ED Course  Procedures (including critical care time) DIAGNOSTIC STUDIES: Oxygen Saturation is 98% on RA, normal by my interpretation.    COORDINATION OF CARE: 5:20 PM-Discussed treatment plan which includes test for strep throat and pt agreed to plan. I advised Pt. if strep is ruled out, it is a viral infection that must run its course.     Labs Review Labs Reviewed  RAPID STREP SCREEN    Imaging  Review No results found.   EKG Interpretation None      MDM   Final diagnoses:  URI (upper respiratory infection)   BP 149/83  Pulse 98  Temp(Src) 98.4 F (36.9 C) (Oral)  Resp 18  SpO2 98%  I personally performed the services described in this documentation, which was scribed in my presence. The recorded information has been reviewed and is accurate.      Fayrene HelperBowie Tran, PA-C 07/21/13 1807

## 2013-07-21 NOTE — ED Provider Notes (Signed)
History/physical exam/procedure(s) were performed by non-physician practitioner and as supervising physician I was immediately available for consultation/collaboration. I have reviewed all notes and am in agreement with care and plan.   Danielle S Ray, MD 07/21/13 2330 

## 2013-07-23 LAB — CULTURE, GROUP A STREP

## 2013-08-16 ENCOUNTER — Encounter: Payer: Self-pay | Admitting: Obstetrics

## 2013-08-16 ENCOUNTER — Ambulatory Visit (INDEPENDENT_AMBULATORY_CARE_PROVIDER_SITE_OTHER): Payer: Medicaid Other | Admitting: Obstetrics

## 2013-08-16 VITALS — BP 130/92 | HR 170 | Temp 98.1°F | Ht 67.0 in | Wt 304.0 lb

## 2013-08-16 DIAGNOSIS — Z01419 Encounter for gynecological examination (general) (routine) without abnormal findings: Secondary | ICD-10-CM

## 2013-08-18 NOTE — Progress Notes (Signed)
Patient cancelled visit. Rescheduled for 09-26-13.

## 2013-09-26 ENCOUNTER — Ambulatory Visit: Payer: Medicaid Other | Admitting: Obstetrics

## 2013-10-25 ENCOUNTER — Ambulatory Visit: Payer: Medicaid Other | Admitting: Obstetrics

## 2013-12-04 ENCOUNTER — Ambulatory Visit (INDEPENDENT_AMBULATORY_CARE_PROVIDER_SITE_OTHER): Payer: Medicaid Other | Admitting: Obstetrics

## 2013-12-04 ENCOUNTER — Encounter: Payer: Self-pay | Admitting: Obstetrics

## 2013-12-04 VITALS — BP 114/73 | HR 71 | Temp 98.2°F | Ht 68.0 in | Wt 307.0 lb

## 2013-12-04 DIAGNOSIS — Z0000 Encounter for general adult medical examination without abnormal findings: Secondary | ICD-10-CM

## 2013-12-04 DIAGNOSIS — R52 Pain, unspecified: Secondary | ICD-10-CM | POA: Insufficient documentation

## 2013-12-04 MED ORDER — IBUPROFEN 800 MG PO TABS: 800.0000 mg | ORAL_TABLET | Freq: Three times a day (TID) | ORAL | Status: DC | PRN

## 2013-12-04 NOTE — Progress Notes (Signed)
Subjective:     Wendy Mayer is a 28 y.o. female here for a routine exam.  Current complaints: Knee pain, bilateral.    Personal health questionnaire:  Is patient [name], have a family history of breast and/or ovarian cancer: no Is there a family history of uterine cancer diagnosed at age < 5850, gastrointestinal cancer, urinary tract cancer, family member who is a Personnel officerLynch syndrome-associated carrier: no Is the patient overweight and hypertensive, family history of diabetes, personal history of gestational diabetes or PCOS: no Is patient over 2555, have PCOS,  family history of premature CHD under age 30, diabetes, smoke, have hypertension or peripheral artery disease:  no At any time, has a partner hit, kicked or otherwise hurt or frightened you?: no Over the past 2 weeks, have you felt down, depressed or hopeless?: no Over the past 2 weeks, have you felt little interest or pleasure in doing things?:no   Gynecologic History Patient's last menstrual period was 11/27/2013 (exact date). Contraception: tubal ligation Last Pap: 2014. Results were: normal Last mammogram: n/a. Results were: n/a  Obstetric History OB History  Gravida Para Term Preterm AB SAB TAB Ectopic Multiple Living  3 3 3       3     # Outcome Date GA Lbr Len/2nd Weight Sex Delivery Anes PTL Lv  3 Term      Vag-Spont None  Y  2 Term      Vag-Spont EPI  Y  1 Term      Vag-Spont EPI  Y      Past Medical History  Diagnosis Date  . Hypertension     Past Surgical History  Procedure Laterality Date  . Hernia repair      Current outpatient prescriptions: ibuprofen (ADVIL,MOTRIN) 800 MG tablet, Take 1 tablet (800 mg total) by mouth every 8 (eight) hours as needed., Disp: 30 tablet, Rfl: 5 No Known Allergies  History  Substance Use Topics  . Smoking status: Never Smoker   . Smokeless tobacco: Never Used  . Alcohol Use: 1.2 oz/week    2 Not specified per week    Family History  Problem Relation Age of  Onset  . Hypertension Mother   . Cancer Father       Review of Systems  Constitutional: negative for fatigue and weight loss Respiratory: negative for cough and wheezing Cardiovascular: negative for chest pain, fatigue and palpitations Gastrointestinal: negative for abdominal pain and change in bowel habits Musculoskeletal: positive for myalgias, knees Neurological: negative for gait problems and tremors Behavioral/Psych: negative for abusive relationship, depression Endocrine: negative for temperature intolerance   Genitourinary:negative for abnormal menstrual periods, genital lesions, hot flashes, sexual problems and vaginal discharge Integument/breast: negative for breast lump, breast tenderness, nipple discharge and skin lesion(s)    Objective:       BP 114/73 mmHg  Pulse 71  Temp(Src) 98.2 F (36.8 C)  Ht 5\' 8"  (1.727 m)  Wt 307 lb (139.254 kg)  BMI 46.69 kg/m2  LMP 11/27/2013 (Exact Date) General:   alert  Skin:   no rash or abnormalities  Lungs:   clear to auscultation bilaterally  Heart:   regular rate and rhythm, S1, S2 normal, no murmur, click, rub or gallop  Breasts:   normal without suspicious masses, skin or nipple changes or axillary nodes  Abdomen:  normal findings: no organomegaly, soft, non-tender and no hernia  Pelvis:  External genitalia: normal general appearance Urinary system: urethral meatus normal and bladder without fullness, nontender Vaginal: normal  without tenderness, induration or masses Cervix: normal appearance Adnexa: normal bimanual exam Uterus: anteverted and non-tender, normal size   Lab Review Urine pregnancy test Labs reviewed yes Radiologic studies reviewed no    Assessment:    Healthy female exam.    Knee pain, chronic.   Plan:   Referred to Orthopedist for chronic knee pain    Education reviewed: calcium supplements, low fat, low cholesterol diet, safe sex/STD prevention, self breast exams and weight bearing  exercise. Follow up in: 1 year.   Meds ordered this encounter  Medications  . ibuprofen (ADVIL,MOTRIN) 800 MG tablet    Sig: Take 1 tablet (800 mg total) by mouth every 8 (eight) hours as needed.    Dispense:  30 tablet    Refill:  5   Orders Placed This Encounter  Procedures  . WET PREP BY MOLECULAR PROBE  . GC/Chlamydia Probe Amp  . Ambulatory referral to Orthopedics    Referral Priority:  Routine    Referral Type:  Consultation    Number of Visits Requested:  1

## 2013-12-05 ENCOUNTER — Telehealth: Payer: Self-pay

## 2013-12-05 LAB — PAP IG W/ RFLX HPV ASCU

## 2013-12-05 LAB — GC/CHLAMYDIA PROBE AMP
CT PROBE, AMP APTIMA: NEGATIVE
GC Probe RNA: NEGATIVE

## 2013-12-05 NOTE — Telephone Encounter (Signed)
called to let patient know Wendy Mayer was trying to reach her for appt. They have all her info. Gave her the phone number to call them back.

## 2013-12-12 ENCOUNTER — Other Ambulatory Visit: Payer: Self-pay | Admitting: *Deleted

## 2013-12-12 DIAGNOSIS — B9689 Other specified bacterial agents as the cause of diseases classified elsewhere: Secondary | ICD-10-CM

## 2013-12-12 DIAGNOSIS — B379 Candidiasis, unspecified: Secondary | ICD-10-CM

## 2013-12-12 DIAGNOSIS — N760 Acute vaginitis: Principal | ICD-10-CM

## 2013-12-12 MED ORDER — METRONIDAZOLE 500 MG PO TABS: 500.0000 mg | ORAL_TABLET | Freq: Two times a day (BID) | ORAL | Status: DC

## 2013-12-12 MED ORDER — FLUCONAZOLE 150 MG PO TABS: 150.0000 mg | ORAL_TABLET | Freq: Once | ORAL | Status: DC

## 2013-12-12 NOTE — Progress Notes (Signed)
Pt made aware of lab results. Pt made aware that Diflucan and Metronidazole were sent to pharmacy.  Pt advised to contact office if any problems with Rx.

## 2014-11-19 ENCOUNTER — Emergency Department (HOSPITAL_COMMUNITY)
Admission: EM | Admit: 2014-11-19 | Discharge: 2014-11-19 | Disposition: A | Discharge status: Home / Self Care | Payer: Medicaid Other | Attending: Physician Assistant | Admitting: Physician Assistant

## 2014-11-19 DIAGNOSIS — R220 Localized swelling, mass and lump, head: Secondary | ICD-10-CM | POA: Insufficient documentation

## 2014-11-19 DIAGNOSIS — I10 Essential (primary) hypertension: Secondary | ICD-10-CM | POA: Insufficient documentation

## 2014-11-19 DIAGNOSIS — R609 Edema, unspecified: Secondary | ICD-10-CM

## 2014-11-19 MED ORDER — DIPHENHYDRAMINE HCL 25 MG PO CAPS
25.0000 mg | ORAL_CAPSULE | Freq: Once | ORAL | Status: AC
  Administered 2014-11-19: 25 mg via ORAL
  Filled 2014-11-19: qty 1

## 2014-11-19 MED ORDER — DIPHENHYDRAMINE HCL 25 MG PO TABS: 25.0000 mg | ORAL_TABLET | Freq: Four times a day (QID) | ORAL | Status: DC | PRN

## 2014-11-19 NOTE — Discharge Instructions (Signed)
Edema °Edema is an abnormal buildup of fluids in your body tissues. Edema is somewhat dependent on gravity to pull the fluid to the lowest place in your body. That makes the condition more common in the legs and thighs (lower extremities). Painless swelling of the feet and ankles is common and becomes more likely as you get older. It is also common in looser tissues, like around your eyes.  °When the affected area is squeezed, the fluid may move out of that spot and leave a dent for a few moments. This dent is called pitting.  °CAUSES  °There are many possible causes of edema. Eating too much salt and being on your feet or sitting for a long time can cause edema in your legs and ankles. Hot weather may make edema worse. Common medical causes of edema include: °· Heart failure. °· Liver disease. °· Kidney disease. °· Weak blood vessels in your legs. °· Cancer. °· An injury. °· Pregnancy. °· Some medications. °· Obesity.  °SYMPTOMS  °Edema is usually painless. Your skin may look swollen or shiny.  °DIAGNOSIS  °Your health care provider may be able to diagnose edema by asking about your medical history and doing a physical exam. You may need to have tests such as X-rays, an electrocardiogram, or blood tests to check for medical conditions that may cause edema.  °TREATMENT  °Edema treatment depends on the cause. If you have heart, liver, or kidney disease, you need the treatment appropriate for these conditions. General treatment may include: °· Elevation of the affected body part above the level of your heart. °· Compression of the affected body part. Pressure from elastic bandages or support stockings squeezes the tissues and forces fluid back into the blood vessels. This keeps fluid from entering the tissues. °· Restriction of fluid and salt intake. °· Use of a water pill (diuretic). These medications are appropriate only for some types of edema. They pull fluid out of your body and make you urinate more often. This  gets rid of fluid and reduces swelling, but diuretics can have side effects. Only use diuretics as directed by your health care provider. °HOME CARE INSTRUCTIONS  °· Keep the affected body part above the level of your heart when you are lying down.   °· Do not sit still or stand for prolonged periods.   °· Do not put anything directly under your knees when lying down. °· Do not wear constricting clothing or garters on your upper legs.   °· Exercise your legs to work the fluid back into your blood vessels. This may help the swelling go down.   °· Wear elastic bandages or support stockings to reduce ankle swelling as directed by your health care provider.   °· Eat a low-salt diet to reduce fluid if your health care provider recommends it.   °· Only take medicines as directed by your health care provider.  °SEEK MEDICAL CARE IF:  °· Your edema is not responding to treatment. °· You have heart, liver, or kidney disease and notice symptoms of edema. °· You have edema in your legs that does not improve after elevating them.   °· You have sudden and unexplained weight gain. °SEEK IMMEDIATE MEDICAL CARE IF:  °· You develop shortness of breath or chest pain.   °· You cannot breathe when you lie down. °· You develop pain, redness, or warmth in the swollen areas.   °· You have heart, liver, or kidney disease and suddenly get edema. °· You have a fever and your symptoms suddenly get worse. °MAKE SURE YOU:  °·   Understand these instructions. °· Will watch your condition. °· Will get help right away if you are not doing well or get worse. °  °This information is not intended to replace advice given to you by your health care provider. Make sure you discuss any questions you have with your health care provider. °  °Document Released: 01/19/2005 Document Revised: 02/09/2014 Document Reviewed: 11/11/2012 °Elsevier Interactive Patient Education ©2016 [employer] ° °

## 2014-11-19 NOTE — ED Notes (Addendum)
Pt reports she ate lambs shoulders yesterday, had never eaten that before, took a nap and woke up with right sided lower facial swelling, reports some swelling on left side of mouth as well. Reports numbness, denies pain. Denies SOB or difficulty breathing. Denies dental issues at the moment. Reports she is currently not taking any medications.

## 2014-11-19 NOTE — ED Provider Notes (Signed)
CSN: [id number]     Arrival date & time 11/19/14  0844 History   First MD Initiated Contact with Patient 11/19/14 [phone]     Chief Complaint  Patient presents with  . Facial Swelling     (Consider location/radiation/quality/duration/timing/severity/associated sxs/prior Treatment) HPI   Patient is a very pleasant 29 year old female presenting with past mental history of hypertension presenting with facial swelling. Patient ate a very salty lamb shoulder history evening went to sleep and then developed right-sided facial swelling.  Patient states she slept overnight and then woke up this morning and still has some right facial swelling. Patient denies any fevers. She denies any numbness tingling or weakness. Denies any rash. Denies any difficulty hearing. Deneis sinus infection recently.  Past Medical History  Diagnosis Date  . Hypertension    Past Surgical History  Procedure Laterality Date  . Hernia repair     Family History  Problem Relation Age of Onset  . Hypertension Mother   . Cancer Father    Social History  Substance Use Topics  . Smoking status: Never Smoker   . Smokeless tobacco: Never Used  . Alcohol Use: 1.2 oz/week    2 Standard drinks or equivalent per week   OB History    Gravida Para Term Preterm AB TAB SAB Ectopic Multiple Living   Review of Systems  Constitutional: Negative for activity change.  HENT: Positive for facial swelling. Negative for congestion, dental problem, ear pain, hearing loss, postnasal drip, rhinorrhea, sinus pressure, sore throat, tinnitus, trouble swallowing and voice change.   Respiratory: Negative for shortness of breath.   Cardiovascular: Negative for chest pain.  Gastrointestinal: Negative for abdominal pain.  Genitourinary: Negative for dysuria.  Musculoskeletal: Negative for gait problem.  Neurological: Positive for facial asymmetry. Negative for dizziness, syncope and weakness.  Psychiatric/Behavioral:  Negative for behavioral problems.      Allergies  Review of patient's allergies indicates no known allergies.  Home Medications   Prior to Admission medications   Medication Sig Start Date End Date Taking? Authorizing Provider  ibuprofen (ADVIL,MOTRIN) 800 MG tablet Take 1 tablet (800 mg total) by mouth every 8 (eight) hours as needed. Patient taking differently: Take 800 mg by mouth daily as needed for moderate pain.  12/04/13  Yes Brock Bad, MD   BP 151/89 mmHg  Pulse 74  Temp(Src) 98.1 F (36.7 C) (Oral)  Resp 16  SpO2 100% Physical Exam  Constitutional: She is oriented to person, place, and time. She appears well-developed and well-nourished.  HENT:  Head: Normocephalic and atraumatic.  Mild right facial swelling. No facial weakness. Right TM and left TM are without vesicles. No rash. No erythema erythema. No warmth.  Eyes: Conjunctivae are normal. Right eye exhibits no discharge.  Neck: Neck supple.  Cardiovascular: Normal rate, regular rhythm and normal heart sounds.   No murmur heard. Pulmonary/Chest: Effort normal and breath sounds normal. She has no wheezes. She has no rales.  Abdominal: Soft. She exhibits no distension. There is no tenderness.  Musculoskeletal: Normal range of motion. She exhibits no edema.  Neurological: She is oriented to person, place, and time. No cranial nerve deficit.  Skin: Skin is warm and dry. No rash noted. She is not diaphoretic.  Psychiatric: She has a normal mood and affect. Her behavior is normal.  Nursing note and vitals reviewed.   ED Course  Procedures (including critical care time) Labs Review Labs  Reviewed - No data to display  Imaging Review No results found. I have personally reviewed and evaluated these images and lab results as part of my medical decision-making.   EKG Interpretation None      MDM   Final diagnoses:  None    Patient is a very healthy asymptomatic [age] year old female with mild right  facial swelling. Considered on the differential as SVC, Bell's palsy, cellulitis, stroke. However patient's physical exam is really normal. She has no facial weakness, no rash, no warmth. I think most likely patient had mild allergic reaction to lamb that she ate last night versus increased salt intake and lay on her right hand side which causes mild edema.  Patient promises that she will return with any symptoms at all, any numbness, fever, tingling, weakness, rash.  She is no acute distress eating, drinking and ambulating prior to discharge.    Amisadai Woodford Randall AnLyn Windsor Goeken, MD 11/19/14 [phone]

## 2014-12-05 ENCOUNTER — Ambulatory Visit: Payer: Medicaid Other | Admitting: Obstetrics

## 2015-01-14 ENCOUNTER — Ambulatory Visit (INDEPENDENT_AMBULATORY_CARE_PROVIDER_SITE_OTHER): Payer: Medicaid Other | Admitting: Obstetrics

## 2015-01-14 ENCOUNTER — Encounter: Payer: Self-pay | Admitting: Obstetrics

## 2015-01-14 VITALS — BP 141/82 | HR 82 | Ht 66.0 in | Wt 321.0 lb

## 2015-01-14 DIAGNOSIS — Z01419 Encounter for gynecological examination (general) (routine) without abnormal findings: Secondary | ICD-10-CM | POA: Diagnosis not present

## 2015-01-14 DIAGNOSIS — Z0000 Encounter for general adult medical examination without abnormal findings: Secondary | ICD-10-CM | POA: Diagnosis not present

## 2015-01-14 DIAGNOSIS — J301 Allergic rhinitis due to pollen: Secondary | ICD-10-CM

## 2015-01-14 DIAGNOSIS — R52 Pain, unspecified: Secondary | ICD-10-CM

## 2015-01-14 DIAGNOSIS — N946 Dysmenorrhea, unspecified: Secondary | ICD-10-CM

## 2015-01-14 MED ORDER — IBUPROFEN 800 MG PO TABS: 800.0000 mg | ORAL_TABLET | Freq: Three times a day (TID) | ORAL | Status: DC | PRN

## 2015-01-14 MED ORDER — LORATADINE 10 MG PO TABS: 10.0000 mg | ORAL_TABLET | Freq: Every day | ORAL | Status: DC

## 2015-01-15 ENCOUNTER — Encounter: Payer: Self-pay | Admitting: Obstetrics

## 2015-01-15 NOTE — Progress Notes (Signed)
Subjective:        Wendy Mayer is a 29 y.o. female here for a routine exam.  Current complaints: none.    Personal health questionnaire:  Is patient [name], have a family history of breast and/or ovarian cancer: no Is there a family history of uterine cancer diagnosed at age < 7650, gastrointestinal cancer, urinary tract cancer, family member who is a Personnel officerLynch syndrome-associated carrier: no Is the patient overweight and hypertensive, family history of diabetes, personal history of gestational diabetes, preeclampsia or PCOS: no Is patient over 2555, have PCOS,  family history of premature CHD under age 31, diabetes, smoke, have hypertension or peripheral artery disease:  no At any time, has a partner hit, kicked or otherwise hurt or frightened you?: no Over the past 2 weeks, have you felt down, depressed or hopeless?: no Over the past 2 weeks, have you felt little interest or pleasure in doing things?:no   Gynecologic History Patient's last menstrual period was 12/31/2014. Contraception: tubal ligation Last Pap: unknown. Results were: normal Last mammogram: n/a. Results were: n/a  Obstetric History OB History  Gravida Para Term Preterm AB SAB TAB Ectopic Multiple Living  3 3 3       3     # Outcome Date GA Lbr Len/2nd Weight Sex Delivery Anes PTL Lv  3 Term      Vag-Spont None  Y  2 Term      Vag-Spont EPI  Y  1 Term      Vag-Spont EPI  Y      Past Medical History  Diagnosis Date  . Hypertension     Past Surgical History  Procedure Laterality Date  . Hernia repair       Current outpatient prescriptions:  .  diphenhydrAMINE (BENADRYL) 25 MG tablet, Take 1 tablet (25 mg total) by mouth every 6 (six) hours as needed. (Patient not taking: Reported on 01/14/2015), Disp: 30 tablet, Rfl: 0 .  ibuprofen (ADVIL,MOTRIN) 800 MG tablet, Take 1 tablet (800 mg total) by mouth every 8 (eight) hours as needed., Disp: 30 tablet, Rfl: 6 .  loratadine (CLARITIN) 10 MG tablet,  Take 1 tablet (10 mg total) by mouth daily., Disp: 30 tablet, Rfl: 11 No Known Allergies  Social History  Substance Use Topics  . Smoking status: Never Smoker   . Smokeless tobacco: Never Used  . Alcohol Use: 1.2 oz/week    2 Standard drinks or equivalent per week    Family History  Problem Relation Age of Onset  . Hypertension Mother   . Cancer Father       Review of Systems  Constitutional: negative for fatigue and weight loss Respiratory: negative for cough and wheezing Cardiovascular: negative for chest pain, fatigue and palpitations Gastrointestinal: negative for abdominal pain and change in bowel habits Musculoskeletal:negative for myalgias Neurological: negative for gait problems and tremors Behavioral/Psych: negative for abusive relationship, depression Endocrine: negative for temperature intolerance   Genitourinary:negative for abnormal menstrual periods, genital lesions, hot flashes, sexual problems and vaginal discharge Integument/breast: negative for breast lump, breast tenderness, nipple discharge and skin lesion(s)    Objective:       BP 141/82 mmHg  Pulse 82  Ht 5\' 6"  (1.676 m)  Wt 321 lb (145.605 kg)  BMI 51.84 kg/m2  LMP 12/31/2014 General:   alert  Skin:   no rash or abnormalities  Lungs:   clear to auscultation bilaterally  Heart:   regular rate and rhythm, S1, S2 normal, no murmur,  click, rub or gallop  Breasts:   normal without suspicious masses, skin or nipple changes or axillary nodes  Abdomen:  normal findings: no organomegaly, soft, non-tender and no hernia  Pelvis:  External genitalia: normal general appearance Urinary system: urethral meatus normal and bladder without fullness, nontender Vaginal: normal without tenderness, induration or masses Cervix: normal appearance Adnexa: normal bimanual exam Uterus: anteverted and non-tender, normal size   Lab Review Urine pregnancy test Labs reviewed yes Radiologic studies reviewed  no    Assessment:    Healthy female exam.    Plan:    Education reviewed: calcium supplements, low fat, low cholesterol diet, safe sex/STD prevention, self breast exams and weight bearing exercise. Contraception: tubal ligation. Follow up in: 1 year.   Meds ordered this encounter  Medications  . ibuprofen (ADVIL,MOTRIN) 800 MG tablet    Sig: Take 1 tablet (800 mg total) by mouth every 8 (eight) hours as needed.    Dispense:  30 tablet    Refill:  6  . loratadine (CLARITIN) 10 MG tablet    Sig: Take 1 tablet (10 mg total) by mouth daily.    Dispense:  30 tablet    Refill:  11   Orders Placed This Encounter  Procedures  . SureSwab, Vaginosis/Vaginitis Plus

## 2015-01-16 LAB — PAP, TP IMAGING W/ HPV RNA, RFLX HPV TYPE 16,18/45: HPV MRNA, HIGH RISK: NOT DETECTED

## 2015-01-18 ENCOUNTER — Other Ambulatory Visit: Payer: Self-pay | Admitting: Obstetrics

## 2015-01-18 DIAGNOSIS — N760 Acute vaginitis: Principal | ICD-10-CM

## 2015-01-18 DIAGNOSIS — B9689 Other specified bacterial agents as the cause of diseases classified elsewhere: Secondary | ICD-10-CM

## 2015-01-18 LAB — SURESWAB, VAGINOSIS/VAGINITIS PLUS
ATOPOBIUM VAGINAE: 7.4 Log (cells/mL)
C. PARAPSILOSIS, DNA: NOT DETECTED
C. albicans, DNA: NOT DETECTED
C. glabrata, DNA: NOT DETECTED
C. trachomatis RNA, TMA: NOT DETECTED
C. tropicalis, DNA: NOT DETECTED
Gardnerella vaginalis: >8 Log (cells/mL)
LACTOBACILLUS SPECIES: NOT DETECTED Log (cells/mL)
MEGASPHAERA SPECIES: >8 Log (cells/mL)
N. gonorrhoeae RNA, TMA: NOT DETECTED
T. vaginalis RNA, QL TMA: NOT DETECTED

## 2015-01-18 MED ORDER — METRONIDAZOLE 500 MG PO TABS: 500.0000 mg | ORAL_TABLET | Freq: Two times a day (BID) | ORAL | Status: DC

## 2015-08-24 ENCOUNTER — Encounter (HOSPITAL_COMMUNITY): Payer: Self-pay | Admitting: *Deleted

## 2015-08-24 ENCOUNTER — Emergency Department (HOSPITAL_COMMUNITY)
Admission: EM | Admit: 2015-08-24 | Discharge: 2015-08-24 | Disposition: A | Discharge status: Home / Self Care | Payer: Medicaid Other | Attending: Emergency Medicine | Admitting: Emergency Medicine

## 2015-08-24 DIAGNOSIS — I10 Essential (primary) hypertension: Secondary | ICD-10-CM | POA: Diagnosis not present

## 2015-08-24 DIAGNOSIS — Z792 Long term (current) use of antibiotics: Secondary | ICD-10-CM | POA: Insufficient documentation

## 2015-08-24 DIAGNOSIS — Z79899 Other long term (current) drug therapy: Secondary | ICD-10-CM | POA: Insufficient documentation

## 2015-08-24 DIAGNOSIS — L0211 Cutaneous abscess of neck: Secondary | ICD-10-CM | POA: Insufficient documentation

## 2015-08-24 MED ORDER — SULFAMETHOXAZOLE-TRIMETHOPRIM 800-160 MG PO TABS: 1.0000 | ORAL_TABLET | Freq: Two times a day (BID) | ORAL | Status: AC

## 2015-08-24 MED ORDER — SULFAMETHOXAZOLE-TRIMETHOPRIM 800-160 MG PO TABS: 1.0000 | ORAL_TABLET | Freq: Once | ORAL | Status: DC

## 2015-08-24 NOTE — ED Provider Notes (Signed)
CSN: [id number]     Arrival date & time 08/24/15  1054 History   First MD Initiated Contact with Patient 08/24/15 1104     Chief Complaint  Patient presents with  . Abscess      HPI Presents with area of irritation of her right lateral neck which been present over the past several weeks.  She reports no drainage.  She denies fevers and chills.  No difficulty breathing or swallowing.  No other complaints.  She states at times it itches.  Pain is mild in severity and worse with palpation.   Past Medical History  Diagnosis Date  . Hypertension    Past Surgical History  Procedure Laterality Date  . Hernia repair     Family History  Problem Relation Age of Onset  . Hypertension Mother   . Cancer Father    Social History  Substance Use Topics  . Smoking status: Never Smoker   . Smokeless tobacco: Never Used  . Alcohol Use: 1.2 oz/week    2 Standard drinks or equivalent per week   OB History    Gravida Para Term Preterm AB TAB SAB Ectopic Multiple Living   Review of Systems  All other systems reviewed and are negative.     Allergies  Review of patient's allergies indicates no known allergies.  Home Medications   Prior to Admission medications   Medication Sig Start Date End Date Taking? Authorizing Provider  diphenhydrAMINE (BENADRYL) 25 MG tablet Take 1 tablet (25 mg total) by mouth every 6 (six) hours as needed. Patient not taking: Reported on 01/14/2015 11/19/14   Courteney Lyn Mackuen, MD  ibuprofen (ADVIL,MOTRIN) 800 MG tablet Take 1 tablet (800 mg total) by mouth every 8 (eight) hours as needed. 01/14/15   Brock Bad, MD  loratadine (CLARITIN) 10 MG tablet Take 1 tablet (10 mg total) by mouth daily. 01/14/15   Brock Bad, MD  metroNIDAZOLE (FLAGYL) 500 MG tablet Take 1 tablet (500 mg total) by mouth 2 (two) times daily. 01/18/15   Brock Bad, MD   BP 105/84 mmHg  Pulse 78  Temp(Src) 98.2 F (36.8 C) (Oral)  Resp 18  SpO2  97%  LMP 08/03/2015 Physical Exam  Constitutional: She is oriented to person, place, and time. She appears well-developed and well-nourished.  HENT:  Head: Normocephalic.  Eyes: EOM are normal.  Neck: Normal range of motion. Neck supple.  Simple punctate folliculitis with a small amount of drainage able to be expressed from the area.  No surrounding erythema.  Pulmonary/Chest: Effort normal.  Abdominal: She exhibits no distension.  Musculoskeletal: Normal range of motion.  Neurological: She is alert and oriented to person, place, and time.  Psychiatric: She has a normal mood and affect.  Nursing note and vitals reviewed.   ED Course  Procedures (including critical care time) Labs Review Labs Reviewed - No data to display  Imaging Review No results found. I have personally reviewed and evaluated these images and lab results as part of my medical decision-making.   EKG Interpretation None      MDM   Final diagnoses:  Abscess, neck    Able to express the wound itself and a small amount of drainage was obtained.  This is so small it does not require formal incision and drainage at this time.  It is draining spontaneously on its own.  Patient will be placed on antibiotics.  She understands return to the ER for new or worsening symptoms    Azalia Bilis, MD 08/24/15 1134

## 2015-08-24 NOTE — Discharge Instructions (Signed)

## 2015-08-24 NOTE — ED Notes (Signed)
Pt presents to ED with c/o abscess to right side of neck.

## 2015-11-07 ENCOUNTER — Encounter: Payer: Self-pay | Admitting: Podiatry

## 2015-11-07 ENCOUNTER — Ambulatory Visit (INDEPENDENT_AMBULATORY_CARE_PROVIDER_SITE_OTHER): Payer: Medicaid Other | Admitting: Podiatry

## 2015-11-07 ENCOUNTER — Ambulatory Visit (INDEPENDENT_AMBULATORY_CARE_PROVIDER_SITE_OTHER): Payer: Medicaid Other

## 2015-11-07 VITALS — BP 150/101 | HR 94 | Resp 18 | Ht 66.0 in | Wt 300.0 lb

## 2015-11-07 DIAGNOSIS — L851 Acquired keratosis [keratoderma] palmaris et plantaris: Secondary | ICD-10-CM | POA: Diagnosis not present

## 2015-11-07 DIAGNOSIS — L84 Corns and callosities: Secondary | ICD-10-CM | POA: Diagnosis not present

## 2015-11-07 DIAGNOSIS — M79672 Pain in left foot: Secondary | ICD-10-CM | POA: Diagnosis not present

## 2015-11-07 DIAGNOSIS — M79671 Pain in right foot: Secondary | ICD-10-CM

## 2015-11-07 NOTE — Progress Notes (Signed)
   Subjective:    Patient ID: Wendy Mayer, female    DOB: [birth year], 30 y.o.   MRN: [id number]  HPI    Review of Systems  All other systems reviewed and are negative.      Objective:   Physical Exam        Assessment & Plan:

## 2015-11-09 NOTE — Progress Notes (Signed)
Patient ID: Wendy Mayer, female   DOB: 1985/04/01, 30 y.o.   MRN: [id number] Subjective: Patient presents to the office today for chief complaint of painful callus lesions of the feet. Patient states that the pain is ongoing and is affecting their ability to ambulate without pain. Patient presents today for further treatment and evaluation. Patient states she is taking Motrin 800 for her foot pain  Objective:  Physical Exam General: Alert and oriented x3 in no acute distress  Dermatology: Hyperkeratotic lesion present on the right sub-fourth MPJ. Lesion presents with a central core protruding into the deep tissues. Pain on palpation with a central nucleated core noted.  Skin is warm, dry and supple bilateral lower extremities. Negative for open lesions or macerations.  Vascular: Palpable pedal pulses bilaterally. No edema or erythema noted. Capillary refill within normal limits.  Neurological: Epicritic and protective threshold grossly intact bilaterally.   Musculoskeletal Exam: Pain on palpation at the keratotic lesion noted. Range of motion within normal limits bilateral. Muscle strength 5/5 in all groups bilateral.  Assessment: #1 porokeratosis sub-fourth MPJ right foot #2 pain in right foot   Plan of Care:  #1 Patient evaluated #2 Excisional debridement of  keratoic lesion using a chisel blade was performed without incident.  #3 Treated area(s) with Salinocaine and dressed with light dressing. #4 continue Motrin 800 as needed for pain  #5 Patient is to return to the clinic PRN.   Felecia ShellingBrent M. Shadana Pry, DPM [facility]

## 2016-01-15 ENCOUNTER — Ambulatory Visit: Payer: Self-pay | Admitting: Obstetrics

## 2016-02-25 ENCOUNTER — Other Ambulatory Visit: Payer: Self-pay | Admitting: Obstetrics

## 2016-02-25 DIAGNOSIS — N946 Dysmenorrhea, unspecified: Secondary | ICD-10-CM

## 2016-07-08 ENCOUNTER — Emergency Department (HOSPITAL_COMMUNITY): Payer: Self-pay

## 2016-07-08 ENCOUNTER — Encounter (HOSPITAL_COMMUNITY): Payer: Self-pay | Admitting: Emergency Medicine

## 2016-07-08 ENCOUNTER — Emergency Department (HOSPITAL_COMMUNITY)
Admission: EM | Admit: 2016-07-08 | Discharge: 2016-07-08 | Disposition: A | Discharge status: Home / Self Care | Payer: Self-pay | Attending: Emergency Medicine | Admitting: Emergency Medicine

## 2016-07-08 DIAGNOSIS — I10 Essential (primary) hypertension: Secondary | ICD-10-CM | POA: Insufficient documentation

## 2016-07-08 DIAGNOSIS — L720 Epidermal cyst: Secondary | ICD-10-CM | POA: Insufficient documentation

## 2016-07-08 DIAGNOSIS — R220 Localized swelling, mass and lump, head: Secondary | ICD-10-CM

## 2016-07-08 LAB — PREGNANCY, URINE: PREG TEST UR: NEGATIVE

## 2016-07-08 MED ORDER — SULFAMETHOXAZOLE-TRIMETHOPRIM 800-160 MG PO TABS: 1.0000 | ORAL_TABLET | Freq: Two times a day (BID) | ORAL | 0 refills | Status: AC

## 2016-07-08 MED ORDER — IOPAMIDOL (ISOVUE-300) INJECTION 61%
75.0000 mL | Freq: Once | INTRAVENOUS | Status: AC | PRN
  Administered 2016-07-08: 75 mL via INTRAVENOUS

## 2016-07-08 MED ORDER — IOPAMIDOL (ISOVUE-300) INJECTION 61%
INTRAVENOUS | Status: AC
  Filled 2016-07-08: qty 75

## 2016-07-08 NOTE — ED Notes (Signed)
Patient transported to CT 

## 2016-07-08 NOTE — Discharge Instructions (Signed)
Take medication as directed.  Return if any problems.

## 2016-07-08 NOTE — ED Provider Notes (Signed)
[facility] Provider Note   CSN: [id number] Arrival date & time: 07/08/16  0906     History   Chief Complaint Chief Complaint  Patient presents with  . boil R neck  . Facial Swelling    HPI Wendy Mayer is a 31 y.o. female.  Pt complians of a knot on her neck.  Pt reports she has swelling to her neck and face.  Pt reports this happened in the past and she took antibiotics and symptoms resolved..    The history is provided by the patient. No language interpreter was used.  Neck Injury  This is a new problem. The problem occurs constantly. The problem has been gradually worsening. Nothing aggravates the symptoms. Nothing relieves the symptoms. She has tried nothing for the symptoms.  Pt complains of a knot on the side of her neck.   Past Medical History:  Diagnosis Date  . Hypertension     Patient Active Problem List   Diagnosis Date Noted  . Pain aggravated by activities of daily living 12/04/2013    Past Surgical History:  Procedure Laterality Date  . HERNIA REPAIR      OB History    Gravida Para Term Preterm AB Living   3 3 3     3    SAB TAB Ectopic Multiple Live Births           3       Home Medications    Prior to Admission medications   Not on File    Family History Family History  Problem Relation Age of Onset  . Hypertension Mother   . Cancer Father     Social History Social History  Substance Use Topics  . Smoking status: Never Smoker  . Smokeless tobacco: Never Used  . Alcohol use 1.2 oz/week    2 Standard drinks or equivalent per week     Allergies   Patient has no known allergies.   Review of Systems Review of Systems  All other systems reviewed and are negative.    Physical Exam Updated Vital Signs BP 117/82 (BP Location: Left Arm)   Pulse 64   Temp 98.1 F (36.7 C) (Oral)   Resp 18   LMP 07/03/2016   SpO2 100%   Physical Exam  Constitutional: She is oriented to person, place, and time. She appears  well-developed and well-nourished.  HENT:  Head: Normocephalic.  Right Ear: External ear normal.  Left Ear: External ear normal.  Nose: Nose normal.  Mouth/Throat: Oropharynx is clear and moist.  Eyes: Conjunctivae are normal. Pupils are equal, round, and reactive to light.  Neck: Normal range of motion.  1cm swollen cystic area right side of her neck.  Swelling neck and under chin.   Cardiovascular: Normal rate and regular rhythm.   Pulmonary/Chest: Effort normal and breath sounds normal.  Abdominal: Soft.  Musculoskeletal: Normal range of motion.  Neurological: She is alert and oriented to person, place, and time.  Skin: Skin is warm.  Psychiatric: She has a normal mood and affect.  Nursing note and vitals reviewed.    ED Treatments / Results  Labs (all labs ordered are listed, but only abnormal results are displayed) Labs Reviewed  PREGNANCY, URINE    EKG  EKG Interpretation None       Radiology Ct Soft Tissue Neck W Contrast  Result Date: 07/08/2016 CLINICAL DATA:  Painful neck lesion EXAM: CT NECK WITH CONTRAST TECHNIQUE: Multidetector CT imaging of the neck was performed using  the standard protocol following the bolus administration of intravenous contrast. CONTRAST:  <See Chart> ISOVUE-300 IOPAMIDOL (ISOVUE-300) INJECTION 61% COMPARISON:  None. FINDINGS: Pharynx and larynx: There is enlargement of the adenoid and palatine tonsils as may be seen in the setting of sleep apnea. The nasopharynx, oropharynx and hypopharynx are otherwise normal. The oral cavity and visualized floor of mouth are normal. No retropharyngeal abscess, effusion or adenopathy. The larynx is normal. Salivary glands: The parotid, sublingual and submandibular glands are normal. No sialolithiasis or salivary ductal dilatation. There is excess surgery parotid gland tissue superficial to the right masseter muscle, which is a common variant. Thyroid: Normal Lymph nodes: Right level IIa lymph node measures 1.8  cm. Left level IIa nodes measure up to 1.1 cm. Vascular: Normal course and caliber. Limited intracranial: Normal Visualized orbits: Normal Mastoids and visualized paranasal sinuses: Clear Skeleton: Normal Upper chest: Clear Other: At the posterolateral aspect of the lower neck there is an intermediate attenuation area measuring 1.1 x 1.2 cm. No associated inflammatory change in the surrounding tissues. IMPRESSION: 1. Subcutaneous lower right neck lesion is most likely a sebaceous/dermal inclusion cyst. No evidence of abscess or other fluid collection. 2. Nonspecific bilateral enlarged level IIa cervical lymph nodes. 3. Enlarged adenoid and palatine tonsils, as may be seen in the setting of obstructive sleep apnea. Electronically Signed   By: Deatra RobinsonKevin  Herman M.D.   On: 07/08/2016 13:32    Procedures Procedures (including critical care time)  Medications Ordered in ED Medications  iopamidol (ISOVUE-300) 61 % injection 75 mL (75 mLs Intravenous Contrast Given 07/08/16 1256)     Initial Impression / Assessment and Plan / ED Course  I have reviewed the triage vital signs and the nursing notes.  Pertinent labs & imaging results that were available during my care of the patient were reviewed by me and considered in my medical decision making (see chart for details).      Final Clinical Impressions(s) / ED Diagnoses   Final diagnoses:  Right facial swelling  Epidermal cyst of neck    New Prescriptions New Prescriptions   SULFAMETHOXAZOLE-TRIMETHOPRIM (BACTRIM DS,SEPTRA DS) 800-160 MG TABLET    Take 1 tablet by mouth 2 (two) times daily.  An After Visit Summary was printed and given to the patient.   Elson AreasSofia, Leslie K, PA-C 07/08/16 1508    Linwood DibblesKnapp, Jon, MD 07/09/16 [phone]

## 2016-07-08 NOTE — ED Triage Notes (Signed)
Pt with boil to R neck x 1 week. Pt states painful and now with edema to R jaw / face.

## 2017-05-15 ENCOUNTER — Other Ambulatory Visit: Payer: Self-pay | Admitting: Obstetrics

## 2018-01-20 ENCOUNTER — Other Ambulatory Visit: Payer: Self-pay | Admitting: Obstetrics

## 2018-06-29 ENCOUNTER — Encounter (HOSPITAL_COMMUNITY): Payer: Self-pay

## 2018-06-29 ENCOUNTER — Ambulatory Visit (HOSPITAL_COMMUNITY)
Admission: EM | Admit: 2018-06-29 | Discharge: 2018-06-29 | Disposition: A | Discharge status: Home / Self Care | Payer: Self-pay | Attending: Internal Medicine | Admitting: Internal Medicine

## 2018-06-29 ENCOUNTER — Other Ambulatory Visit: Payer: Self-pay

## 2018-06-29 DIAGNOSIS — R21 Rash and other nonspecific skin eruption: Secondary | ICD-10-CM

## 2018-06-29 MED ORDER — PERMETHRIN 5 % EX CREA: TOPICAL_CREAM | CUTANEOUS | 0 refills | Status: DC

## 2018-06-29 NOTE — ED Triage Notes (Signed)
Pt states she has a rash on her arms, legs and chest. This has been going on for a week. It's itchy and dry.

## 2018-06-29 NOTE — Discharge Instructions (Signed)
Your rash is concerning for possible scabies, which we will treat with permethrin.  You may use benadryl or topical hydrocortisone cream as needed for itching.  Try to avoid scratching to prevent infection to any open skin . If worsening or no improvement in the next 2 weeks please return to be seen.

## 2018-06-29 NOTE — ED Provider Notes (Signed)
[facility]    CSN: [id number] Arrival date & time: 06/29/18  1356     History   Chief Complaint Chief Complaint  Patient presents with  . Rash    HPI Wendy Mayer is a 33 y.o. female.   Wendy Mayer presents with complaints of itching rash. Started approximately 1 week ago. Primarily to hands but also to lower legs, pants line of low back, bra line of back, and chest. More itchy at night. No pain. No fevers. Denies any previous similar. No known exposure to potential allergies. No new products etc. Has tried applying cortisone cream as well as antibiotic cream which hasn't helped. No others in the household with rash. Without contributing medical history.       ROS per HPI, negative if not otherwise mentioned.      Past Medical History:  Diagnosis Date  . Hypertension     Patient Active Problem List   Diagnosis Date Noted  . Pain aggravated by activities of daily living 12/04/2013    Past Surgical History:  Procedure Laterality Date  . HERNIA REPAIR      OB History    Gravida  3   Para  3   Term  3   Preterm      AB      Living  3     SAB      TAB      Ectopic      Multiple      Live Births  3            Home Medications    Prior to Admission medications   Medication Sig Start Date End Date Taking? Authorizing Provider  ibuprofen (ADVIL,MOTRIN) 800 MG tablet TAKE 1 TABLET BY MOUTH EVERY 8 HOURS AS NEEDED 01/21/18   Brock Bad, MD  permethrin (ELIMITE) 5 % cream Apply toes to neck, leave on for 10-12 hours and rinse off. May repeat in 14 days 06/29/18   Georgetta Haber, NP    Family History Family History  Problem Relation Age of Onset  . Hypertension Mother   . Cancer Father     Social History Social History   Tobacco Use  . Smoking status: Never Smoker  . Smokeless tobacco: Never Used  Substance Use Topics  . Alcohol use: Yes    Alcohol/week: 2.0 standard drinks    Types: 2 Standard drinks or  equivalent per week  . Drug use: No     Allergies   Patient has no known allergies.   Review of Systems Review of Systems   Physical Exam Triage Vital Signs ED Triage Vitals  Enc Vitals Group     BP 06/29/18 1421 (!) 135/96     Pulse Rate 06/29/18 1421 77     Resp 06/29/18 1421 18     Temp 06/29/18 1421 98.2 F (36.8 C)     Temp Source 06/29/18 1421 Oral     SpO2 06/29/18 1421 100 %     Weight 06/29/18 1419 280 lb (127 kg)     Height --      Head Circumference --      Peak Flow --      Pain Score 06/29/18 1419 0     Pain Loc --      Pain Edu? --      Excl. in [place]? --    No data found.  Updated Vital Signs BP (!) 135/96 (BP Location: Right Arm)  Pulse 77   Temp 98.2 F (36.8 C) (Oral)   Resp 18   Wt 280 lb (127 kg)   LMP 06/14/2018   SpO2 100%   BMI 45.19 kg/m     Physical Exam Constitutional:      General: She is not in acute distress.    Appearance: She is well-developed.  Cardiovascular:     Rate and Rhythm: Normal rate and regular rhythm.     Heart sounds: Normal heart sounds.  Pulmonary:     Effort: Pulmonary effort is normal.     Breath sounds: Normal breath sounds.  Skin:    General: Skin is warm and dry.     Comments: Papules scattered to dorsum of fingers to both hands as well as to interdigit space; noted scattered papules and clusters of papules to forearms and near antecubital space; bilateral lower legs with scattered papules, right shin with bruising discoloration from aggressive scratching with abrasions to rash noted; skin toned papules, some with apparent clear fluid? Non tender   Neurological:     Mental Status: She is alert and oriented to person, place, and time.      UC Treatments / Results  Labs (all labs ordered are listed, but only abnormal results are displayed) Labs Reviewed - No data to display  EKG None  Radiology No results found.  Procedures Procedures (including critical care time)  Medications Ordered in  UC Medications - No data to display  Initial Impression / Assessment and Plan / UC Course  I have reviewed the triage vital signs and the nursing notes.  Pertinent labs & imaging results that were available during my care of the patient were reviewed by me and considered in my medical decision making (see chart for details).     Papular rash with significant itching with affected areas consistent with possible scabies infestation. permethrin provided. Did not extensive bruising vs hyperpigmentation to right lower leg which appears to be from excess itching- patient endorses this. Follow up if no improvement or worsening. Patient verbalized understanding and agreeable to plan.   Final Clinical Impressions(s) / UC Diagnoses   Final diagnoses:  Rash     Discharge Instructions     Your rash is concerning for possible scabies, which we will treat with permethrin.  You may use benadryl or topical hydrocortisone cream as needed for itching.  Try to avoid scratching to prevent infection to any open skin . If worsening or no improvement in the next 2 weeks please return to be seen.     ED Prescriptions    Medication Sig Dispense Auth. Provider   permethrin (ELIMITE) 5 % cream Apply toes to neck, leave on for 10-12 hours and rinse off. May repeat in 14 days 60 g Georgetta HaberBurky, Kavir Savoca B, NP     Controlled Substance Prescriptions [place] Controlled Substance Registry consulted? Not Applicable   Georgetta HaberBurky, Kaysen Deal B, NP 06/29/18 1442

## 2018-07-31 ENCOUNTER — Ambulatory Visit (INDEPENDENT_AMBULATORY_CARE_PROVIDER_SITE_OTHER): Payer: Self-pay

## 2018-07-31 ENCOUNTER — Ambulatory Visit (HOSPITAL_COMMUNITY)
Admission: EM | Admit: 2018-07-31 | Discharge: 2018-07-31 | Disposition: A | Discharge status: Home / Self Care | Payer: Self-pay | Attending: Emergency Medicine | Admitting: Emergency Medicine

## 2018-07-31 ENCOUNTER — Encounter (HOSPITAL_COMMUNITY): Payer: Self-pay

## 2018-07-31 DIAGNOSIS — S93401A Sprain of unspecified ligament of right ankle, initial encounter: Secondary | ICD-10-CM

## 2018-07-31 DIAGNOSIS — L2082 Flexural eczema: Secondary | ICD-10-CM

## 2018-07-31 MED ORDER — NAPROXEN 500 MG PO TABS: 500.0000 mg | ORAL_TABLET | Freq: Two times a day (BID) | ORAL | 0 refills | Status: DC

## 2018-07-31 MED ORDER — TRIAMCINOLONE ACETONIDE 0.1 % EX CREA: 1.0000 "application " | TOPICAL_CREAM | Freq: Two times a day (BID) | CUTANEOUS | 0 refills | Status: DC

## 2018-07-31 MED ORDER — TRIAMCINOLONE 0.1 % CREAM:EUCERIN CREAM 1:1: 1.0000 "application " | TOPICAL_CREAM | Freq: Two times a day (BID) | CUTANEOUS | 0 refills | Status: DC

## 2018-07-31 NOTE — Discharge Instructions (Signed)
No fracture on Xray, most likely strain of ligaments in ankle Please wear brace for the next 2 weeks May try naprosyn twice daily as alternative to ibuprofen 800 Ice and elevate  May apply triamcinolone cream twice daily to rash and may use with thicker creams like Eucerin, CeraVe or Cetaphil over lotions May take printed prescription to compounding pharmacy  Please follow-up if developing any increased pain swelling or redness to ankle

## 2018-07-31 NOTE — ED Triage Notes (Signed)
Pt C/O right ankle pain and swelling. Pt states it hurts when she walks or try to put a shoe on . Pt states it started two weeks ago. Pt denies any injury to her ankle.

## 2018-08-01 NOTE — ED Provider Notes (Signed)
[facility]    CSN: [id number] Arrival date & time: 07/31/18  1209      History   Chief Complaint Chief Complaint  Patient presents with  . Ankle Pain    HPI Wendy Mayer is a 33 y.o. female history of hypertension presenting today for evaluation of right ankle pain and swelling.  Patient states that for the past 2 weeks she has had pain on the inside of her right ankle.  She denies any injury or increase in activity.  She has had increased pain and difficulty putting on her shoes due to the pain.  She has taken ibuprofen with temporary relief.  Denies previous injury to this ankle.  HPI  Past Medical History:  Diagnosis Date  . Hypertension     Patient Active Problem List   Diagnosis Date Noted  . Pain aggravated by activities of daily living 12/04/2013    Past Surgical History:  Procedure Laterality Date  . HERNIA REPAIR      OB History    Gravida  3   Para  3   Term  3   Preterm      AB      Living  3     SAB      TAB      Ectopic      Multiple      Live Births  3            Home Medications    Prior to Admission medications   Medication Sig Start Date End Date Taking? Authorizing Provider  ibuprofen (ADVIL,MOTRIN) 800 MG tablet TAKE 1 TABLET BY MOUTH EVERY 8 HOURS AS NEEDED 01/21/18   Brock BadHarper, Charles A, MD  naproxen (NAPROSYN) 500 MG tablet Take 1 tablet (500 mg total) by mouth 2 (two) times daily. 07/31/18   Wieters, Hallie C, PA-C  Triamcinolone Acetonide (TRIAMCINOLONE 0.1 % CREAM : EUCERIN) CREA Apply 1 application topically 2 (two) times daily. 07/31/18   Wieters, Hallie C, PA-C  triamcinolone cream (KENALOG) 0.1 % Apply 1 application topically 2 (two) times daily. 07/31/18   Wieters, Junius CreamerHallie C, PA-C    Family History Family History  Problem Relation Age of Onset  . Hypertension Mother   . Cancer Father     Social History Social History   Tobacco Use  . Smoking status: Never Smoker  . Smokeless tobacco: Never  Used  Substance Use Topics  . Alcohol use: Yes    Alcohol/week: 2.0 standard drinks    Types: 2 Standard drinks or equivalent per week  . Drug use: No     Allergies   Patient has no known allergies.   Review of Systems Review of Systems  Constitutional: Negative for fatigue and fever.  Eyes: Negative for visual disturbance.  Respiratory: Negative for shortness of breath.   Cardiovascular: Negative for chest pain.  Gastrointestinal: Negative for abdominal pain, nausea and vomiting.  Musculoskeletal: Positive for arthralgias and joint swelling.  Skin: Positive for color change. Negative for rash and wound.  Neurological: Negative for dizziness, weakness, light-headedness and headaches.     Physical Exam Triage Vital Signs ED Triage Vitals  Enc Vitals Group     BP 07/31/18 1348 133/74     Pulse Rate 07/31/18 1348 74     Resp 07/31/18 1348 18     Temp 07/31/18 1348 98.5 F (36.9 C)     Temp Source 07/31/18 1348 Oral     SpO2 07/31/18 1348 100 %  Weight --      Height --      Head Circumference --      Peak Flow --      Pain Score 07/31/18 1346 6     Pain Loc --      Pain Edu? --      Excl. in [place]? --    No data found.  Updated Vital Signs BP 133/74   Pulse 74   Temp 98.5 F (36.9 C) (Oral)   Resp 18   LMP 07/22/2018   SpO2 100%   Visual Acuity Right Eye Distance:   Left Eye Distance:   Bilateral Distance:    Right Eye Near:   Left Eye Near:    Bilateral Near:     Physical Exam Vitals signs and nursing note reviewed.  Constitutional:      Appearance: She is well-developed.     Comments: No acute distress  HENT:     Head: Normocephalic and atraumatic.     Nose: Nose normal.  Eyes:     Conjunctiva/sclera: Conjunctivae normal.  Neck:     Musculoskeletal: Neck supple.  Cardiovascular:     Rate and Rhythm: Normal rate.  Pulmonary:     Effort: Pulmonary effort is normal. No respiratory distress.  Abdominal:     General: There is no distension.   Musculoskeletal: Normal range of motion.     Comments: Right ankle: No obvious swelling, deformity, no overlying erythema or warmth, tender to palpation over medial malleolus.  Nontender over lateral malleolus and anterior ankle.  Tenderness does not extend into the dorsum of foot.  Dorsalis pedis 2+    Skin:    General: Skin is warm and dry.     Comments: Significant hyperpigmentation and dryness with eczematous appearance noted to bilateral anterior shins and bilateral antecubital areas  Neurological:     Mental Status: She is alert and oriented to person, place, and time.      UC Treatments / Results  Labs (all labs ordered are listed, but only abnormal results are displayed) Labs Reviewed - No data to display  EKG None  Radiology Dg Ankle Complete Right  Result Date: 07/31/2018 CLINICAL DATA:  Pain and swelling for approximately 1 week EXAM: RIGHT ANKLE - COMPLETE 3+ VIEW COMPARISON:  None. FINDINGS: Frontal, oblique, and lateral views obtained. There is soft tissue swelling, primarily medially. No fracture or joint effusion is evident. There is no appreciable joint space narrowing or erosion. No bony destruction. There are posterior and inferior calcaneal spurs. Ankle mortise appears intact. IMPRESSION: Soft tissue swelling, most notably medially. No evident fracture or arthropathic change. No erosive change or bony destruction. Ankle mortise appears intact. There are calcaneal spurs. Electronically Signed   By: Lowella Grip III M.D.   On: 07/31/2018 14:42    Procedures Procedures (including critical care time)  Medications Ordered in UC Medications - No data to display  Initial Impression / Assessment and Plan / UC Course  I have reviewed the triage vital signs and the nursing notes.  Pertinent labs & imaging results that were available during my care of the patient were reviewed by me and considered in my medical decision making (see chart for details).     X-ray  negative, most likely ankle sprain, will provide ASO and try Naprosyn as alternative to ibuprofen.  Ice and elevate.  Also recommended Kenalog to help with eczema.  Discussed importance of avoiding scratching in this area to avoid secondary infection.  Discussed  strict return precautions. Patient verbalized understanding and is agreeable with plan.  Final Clinical Impressions(s) / UC Diagnoses   Final diagnoses:  Sprain of right ankle, unspecified ligament, initial encounter  Flexural eczema     Discharge Instructions     No fracture on Xray, most likely strain of ligaments in ankle Please wear brace for the next 2 weeks May try naprosyn twice daily as alternative to ibuprofen 800 Ice and elevate  May apply triamcinolone cream twice daily to rash and may use with thicker creams like Eucerin, CeraVe or Cetaphil over lotions May take printed prescription to compounding pharmacy  Please follow-up if developing any increased pain swelling or redness to ankle   ED Prescriptions    Medication Sig Dispense Auth. Provider   naproxen (NAPROSYN) 500 MG tablet Take 1 tablet (500 mg total) by mouth 2 (two) times daily. 30 tablet Wieters, Hallie C, PA-C   triamcinolone cream (KENALOG) 0.1 % Apply 1 application topically 2 (two) times daily. 80 g Wieters, Hallie C, PA-C   Triamcinolone Acetonide (TRIAMCINOLONE 0.1 % CREAM : EUCERIN) CREA Apply 1 application topically 2 (two) times daily. 1 each Wieters, Junius CreamerHallie C, PA-C     Controlled Substance Prescriptions [place] Controlled Substance Registry consulted? Not Applicable   Lew DawesWieters, Hallie C, [place]PA-C 08/01/18 [phone]

## 2019-01-28 ENCOUNTER — Other Ambulatory Visit: Payer: Self-pay

## 2019-01-28 ENCOUNTER — Encounter (HOSPITAL_COMMUNITY): Payer: Self-pay

## 2019-01-28 ENCOUNTER — Ambulatory Visit (HOSPITAL_COMMUNITY)
Admission: EM | Admit: 2019-01-28 | Discharge: 2019-01-28 | Disposition: A | Discharge status: Home / Self Care | Payer: Self-pay | Attending: Family Medicine | Admitting: Family Medicine

## 2019-01-28 DIAGNOSIS — R21 Rash and other nonspecific skin eruption: Secondary | ICD-10-CM

## 2019-01-28 MED ORDER — PERMETHRIN 5 % EX CREA: TOPICAL_CREAM | CUTANEOUS | 1 refills | Status: DC

## 2019-01-28 MED ORDER — PREDNISONE 10 MG (21) PO TBPK: ORAL_TABLET | ORAL | 0 refills | Status: DC

## 2019-01-28 MED ORDER — DIPHENHYDRAMINE HCL 25 MG PO TABS: 25.0000 mg | ORAL_TABLET | Freq: Four times a day (QID) | ORAL | 0 refills | Status: DC | PRN

## 2019-01-28 NOTE — Discharge Instructions (Addendum)
Treating you for what appears to be a scabies infestation.  I printed some information out about scabies in your discharge packet.  Make sure you wash your sheets in hot water Use the cream as prescribed.  You can repeat the cream in 2 weeks if needed. Apply the cream head to toe and leave on for 8 to 12 hours and then wash off. Benadryl for itching.  Be aware this may make you drowsy.  You could do a Zyrtec daily if you want for itching to be less drowsy. Prednisone daily for 6 days.  Take this with food. Follow up as needed for continued or worsening symptoms

## 2019-01-28 NOTE — ED Triage Notes (Signed)
Pt presents with rash in different areas of her body that is very itchy and progressing for over 2 weeks.

## 2019-01-29 NOTE — ED Provider Notes (Signed)
[facility]    CSN: [id number] Arrival date & time: 01/28/19  1138      History   Chief Complaint Chief Complaint  Patient presents with  . Rash    All Over    HPI Wendy Mayer is a 33 y.o. female.   Pt is a 33 year old female that present today with rash. This has been present  and worsening over the past 2 weeks. The rash is located to bilateral AC, LLE, and neck area.  Describes the rash as very itchy, more at night.  Denies any fever, joint pain. Denies any recent changes in lotions, detergents, foods or other possible irritants. No recent travel. Nobody else at home has the rash. Patient has been outside but denies any contact with plants or insects. No new foods or medications.  Works in a nursing facility. Reporting hx of eczema.  Has been using hydrocortisone cream without any relief.    ROS per HPI    Rash   Past Medical History:  Diagnosis Date  . Hypertension     Patient Active Problem List   Diagnosis Date Noted  . Pain aggravated by activities of daily living 12/04/2013    Past Surgical History:  Procedure Laterality Date  . HERNIA REPAIR      OB History    Gravida  3   Para  3   Term  3   Preterm      AB      Living  3     SAB      TAB      Ectopic      Multiple      Live Births  3            Home Medications    Prior to Admission medications   Medication Sig Start Date End Date Taking? Authorizing Provider  diphenhydrAMINE (BENADRYL) 25 MG tablet Take 1 tablet (25 mg total) by mouth every 6 (six) hours as needed. 01/28/19   Dahlia ByesBast, Makila Colombe A, NP  ibuprofen (ADVIL,MOTRIN) 800 MG tablet TAKE 1 TABLET BY MOUTH EVERY 8 HOURS AS NEEDED 01/21/18   Brock BadHarper, Charles A, MD  naproxen (NAPROSYN) 500 MG tablet Take 1 tablet (500 mg total) by mouth 2 (two) times daily. 07/31/18   Wieters, Hallie C, PA-C  permethrin (ELIMITE) 5 % cream Apply to affected area once 01/28/19   Kito Cuffe A, NP  predniSONE (STERAPRED UNI-PAK  21 TAB) 10 MG (21) TBPK tablet 6 tabs for 1 day, then 5 tabs for 1 das, then 4 tabs for 1 day, then 3 tabs for 1 day, 2 tabs for 1 day, then 1 tab for 1 day 01/28/19   Dahlia ByesBast, Crystalynn Mcinerney A, NP  Triamcinolone Acetonide (TRIAMCINOLONE 0.1 % CREAM : EUCERIN) CREA Apply 1 application topically 2 (two) times daily. 07/31/18   Wieters, Hallie C, PA-C  triamcinolone cream (KENALOG) 0.1 % Apply 1 application topically 2 (two) times daily. 07/31/18   Wieters, Junius CreamerHallie C, PA-C    Family History Family History  Problem Relation Age of Onset  . Hypertension Mother   . Cancer Father     Social History Social History   Tobacco Use  . Smoking status: Never Smoker  . Smokeless tobacco: Never Used  Substance Use Topics  . Alcohol use: Yes    Alcohol/week: 2.0 standard drinks    Types: 2 Standard drinks or equivalent per week  . Drug use: No     Allergies  Patient has no known allergies.   Review of Systems Review of Systems  Skin: Positive for rash.     Physical Exam Triage Vital Signs ED Triage Vitals  Enc Vitals Group     BP 01/28/19 1250 140/82     Pulse Rate 01/28/19 1246 72     Resp 01/28/19 1246 17     Temp 01/28/19 1246 98.4 F (36.9 C)     Temp Source 01/28/19 1246 Oral     SpO2 01/28/19 1246 100 %     Weight --      Height --      Head Circumference --      Peak Flow --      Pain Score 01/28/19 1249 0     Pain Loc --      Pain Edu? --      Excl. in [place]? --    No data found.  Updated Vital Signs BP 140/82 (BP Location: Right Arm)   Pulse 72   Temp 98.4 F (36.9 C) (Oral)   Resp 17   LMP 01/21/2019   SpO2 100%   Visual Acuity Right Eye Distance:   Left Eye Distance:   Bilateral Distance:    Right Eye Near:   Left Eye Near:    Bilateral Near:     Physical Exam Vitals and nursing note reviewed.  Constitutional:      General: She is not in acute distress.    Appearance: Normal appearance. She is not ill-appearing, toxic-appearing or diaphoretic.  HENT:      Head: Normocephalic.     Nose: Nose normal.     Mouth/Throat:     Pharynx: Oropharynx is clear.  Eyes:     Conjunctiva/sclera: Conjunctivae normal.  Pulmonary:     Effort: Pulmonary effort is normal.  Musculoskeletal:        General: Normal range of motion.     Cervical back: Normal range of motion.  Skin:    General: Skin is warm and dry.     Findings: Rash present.  Neurological:     Mental Status: She is alert.  Psychiatric:        Mood and Affect: Mood normal.      UC Treatments / Results  Labs (all labs ordered are listed, but only abnormal results are displayed) Labs Reviewed - No data to display  EKG   Radiology No results found.  Procedures Procedures (including critical care time)  Medications Ordered in UC Medications - No data to display  Initial Impression / Assessment and Plan / UC Course  I have reviewed the triage vital signs and the nursing notes.  Pertinent labs & imaging results that were available during my care of the patient were reviewed by me and considered in my medical decision making (see chart for details).     Rash- treating for scabies vs eczema flare. Hx of both. Seen for similar rash on 06/29/18 and treated for scabies.  Instruction on how to use the Elimite cream. Prednisone daily for 6 days. benadryl for itching.  Final Clinical Impressions(s) / UC Diagnoses   Final diagnoses:  Rash     Discharge Instructions     Treating you for what appears to be a scabies infestation.  I printed some information out about scabies in your discharge packet.  Make sure you wash your sheets in hot water Use the cream as prescribed.  You can repeat the cream in 2 weeks if needed. Apply the cream head  to toe and leave on for 8 to 12 hours and then wash off. Benadryl for itching.  Be aware this may make you drowsy.  You could do a Zyrtec daily if you want for itching to be less drowsy. Prednisone daily for 6 days.  Take this with food. Follow  up as needed for continued or worsening symptoms     ED Prescriptions    Medication Sig Dispense Auth. Provider   permethrin (ELIMITE) 5 % cream Apply to affected area once 60 g Charna Neeb A, NP   predniSONE (STERAPRED UNI-PAK 21 TAB) 10 MG (21) TBPK tablet 6 tabs for 1 day, then 5 tabs for 1 das, then 4 tabs for 1 day, then 3 tabs for 1 day, 2 tabs for 1 day, then 1 tab for 1 day 21 tablet Dulcinea Kinser A, NP   diphenhydrAMINE (BENADRYL) 25 MG tablet Take 1 tablet (25 mg total) by mouth every 6 (six) hours as needed. 30 tablet Dahlia Byes A, NP     PDMP not reviewed this encounter.   Dahlia Byes A, NP 01/29/19 1140

## 2019-04-29 ENCOUNTER — Other Ambulatory Visit: Payer: Self-pay | Admitting: Obstetrics

## 2020-07-22 ENCOUNTER — Other Ambulatory Visit: Payer: Self-pay | Admitting: Obstetrics

## 2020-07-22 NOTE — Telephone Encounter (Signed)
Please review for refill.  

## 2021-01-07 IMAGING — DX RIGHT ANKLE - COMPLETE 3+ VIEW
3 series · 3 of 3 positions shown · non-contrast
Comparison: None.

CLINICAL DATA: Pain and swelling for approximately 1 week

EXAM:
RIGHT ANKLE - COMPLETE 3+ VIEW

[ankle ap]
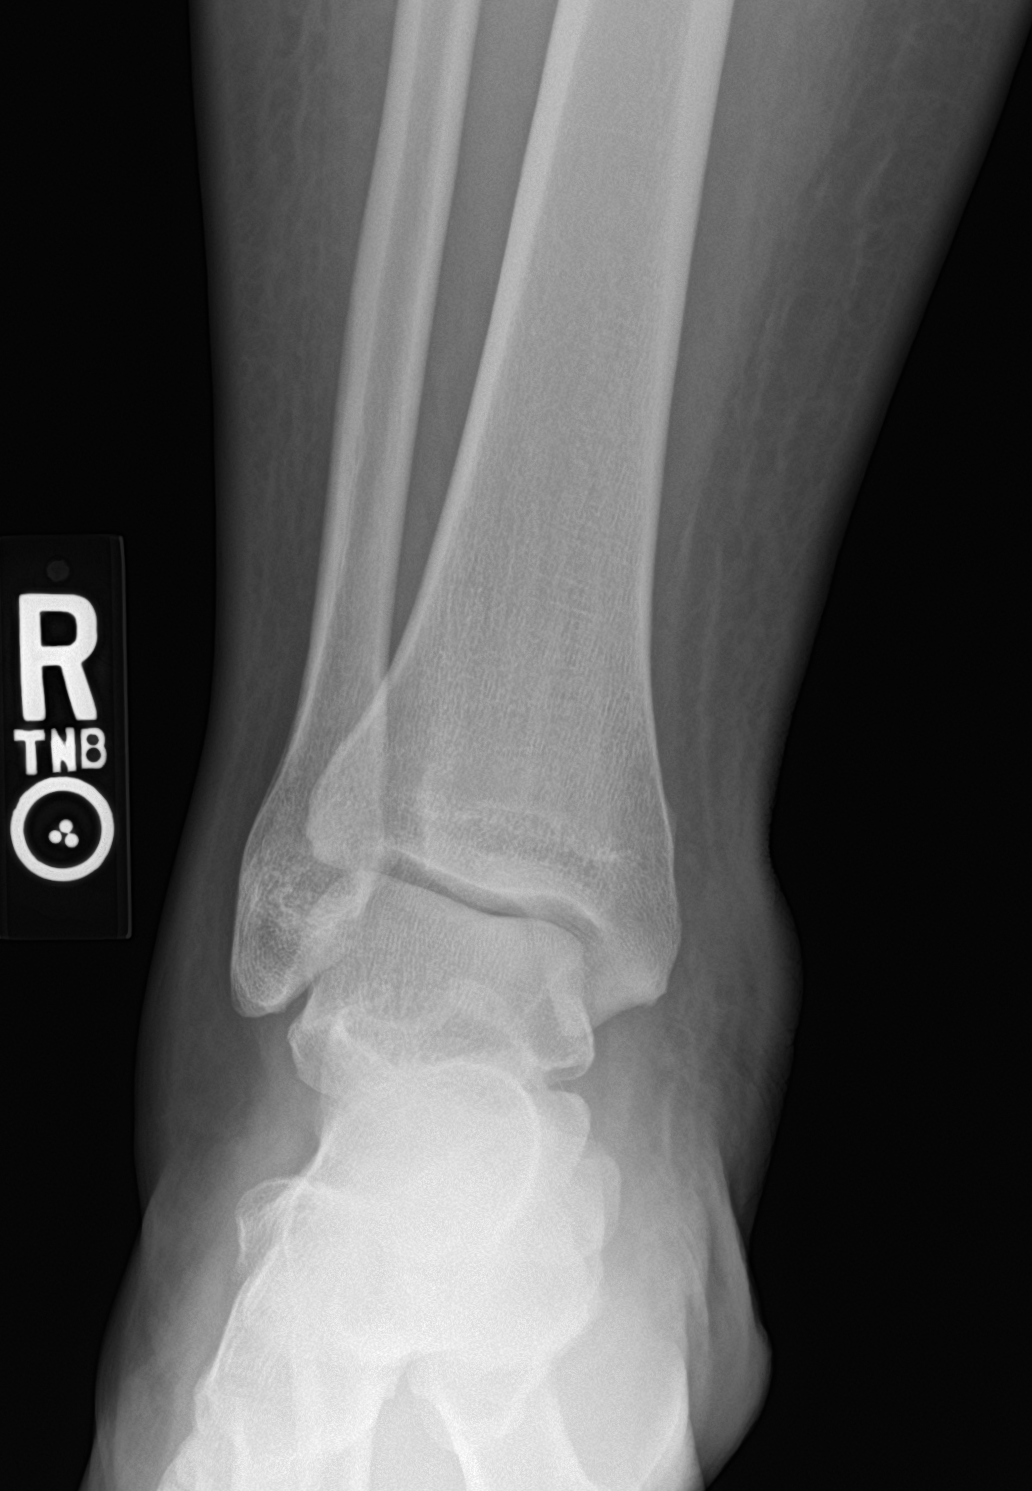

[ankle obl]
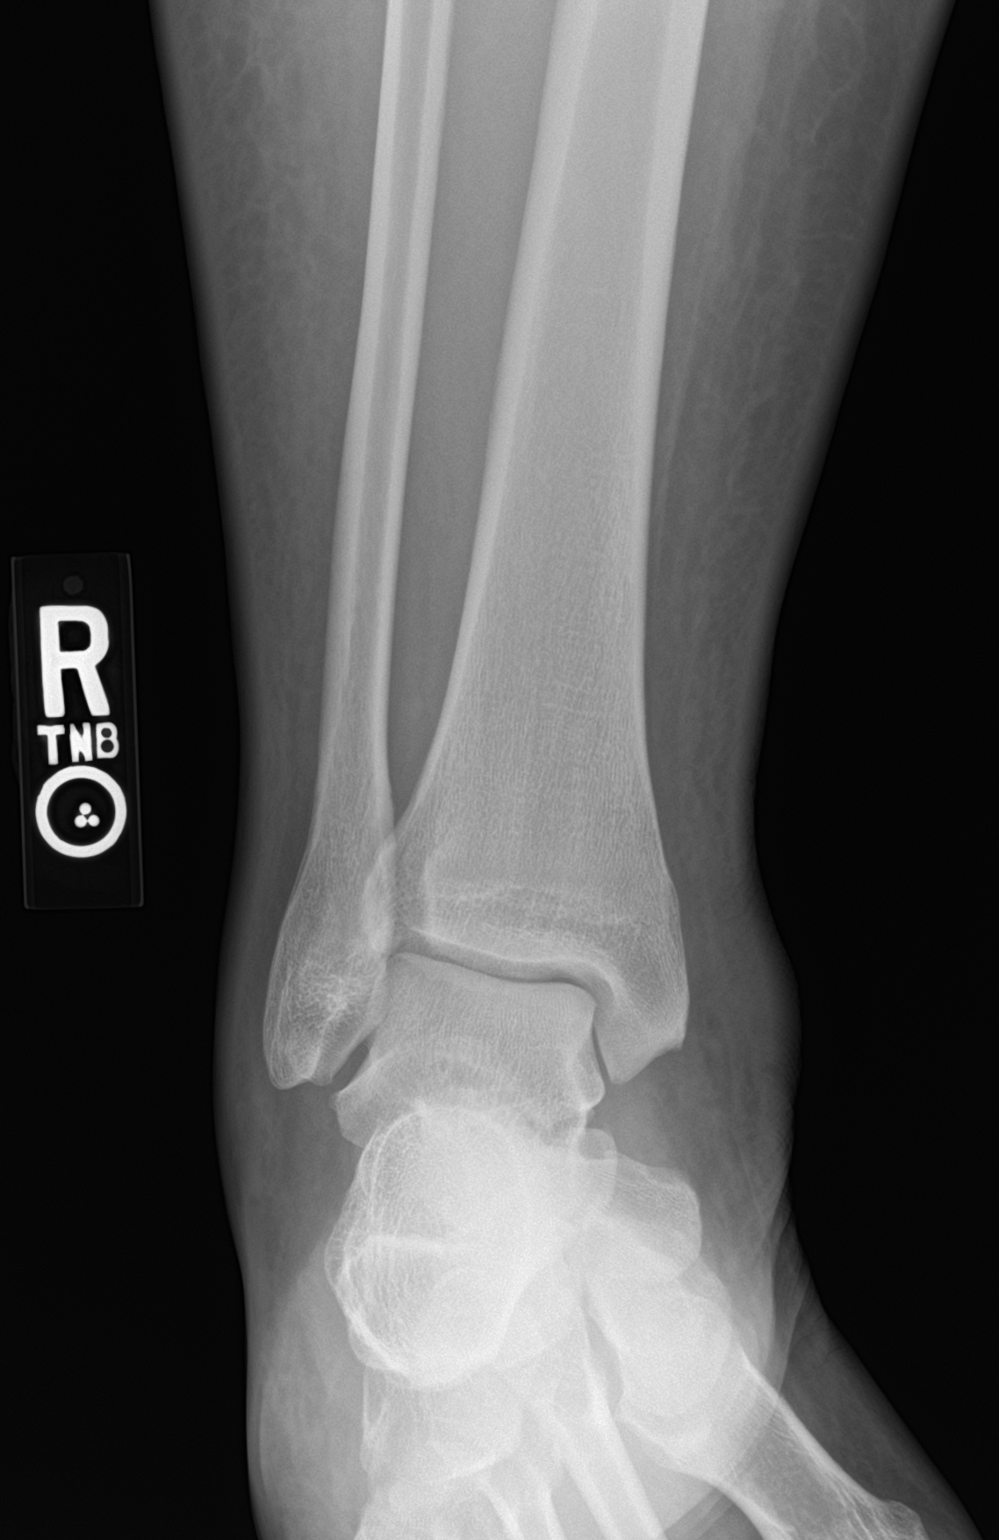

[ankle lat]
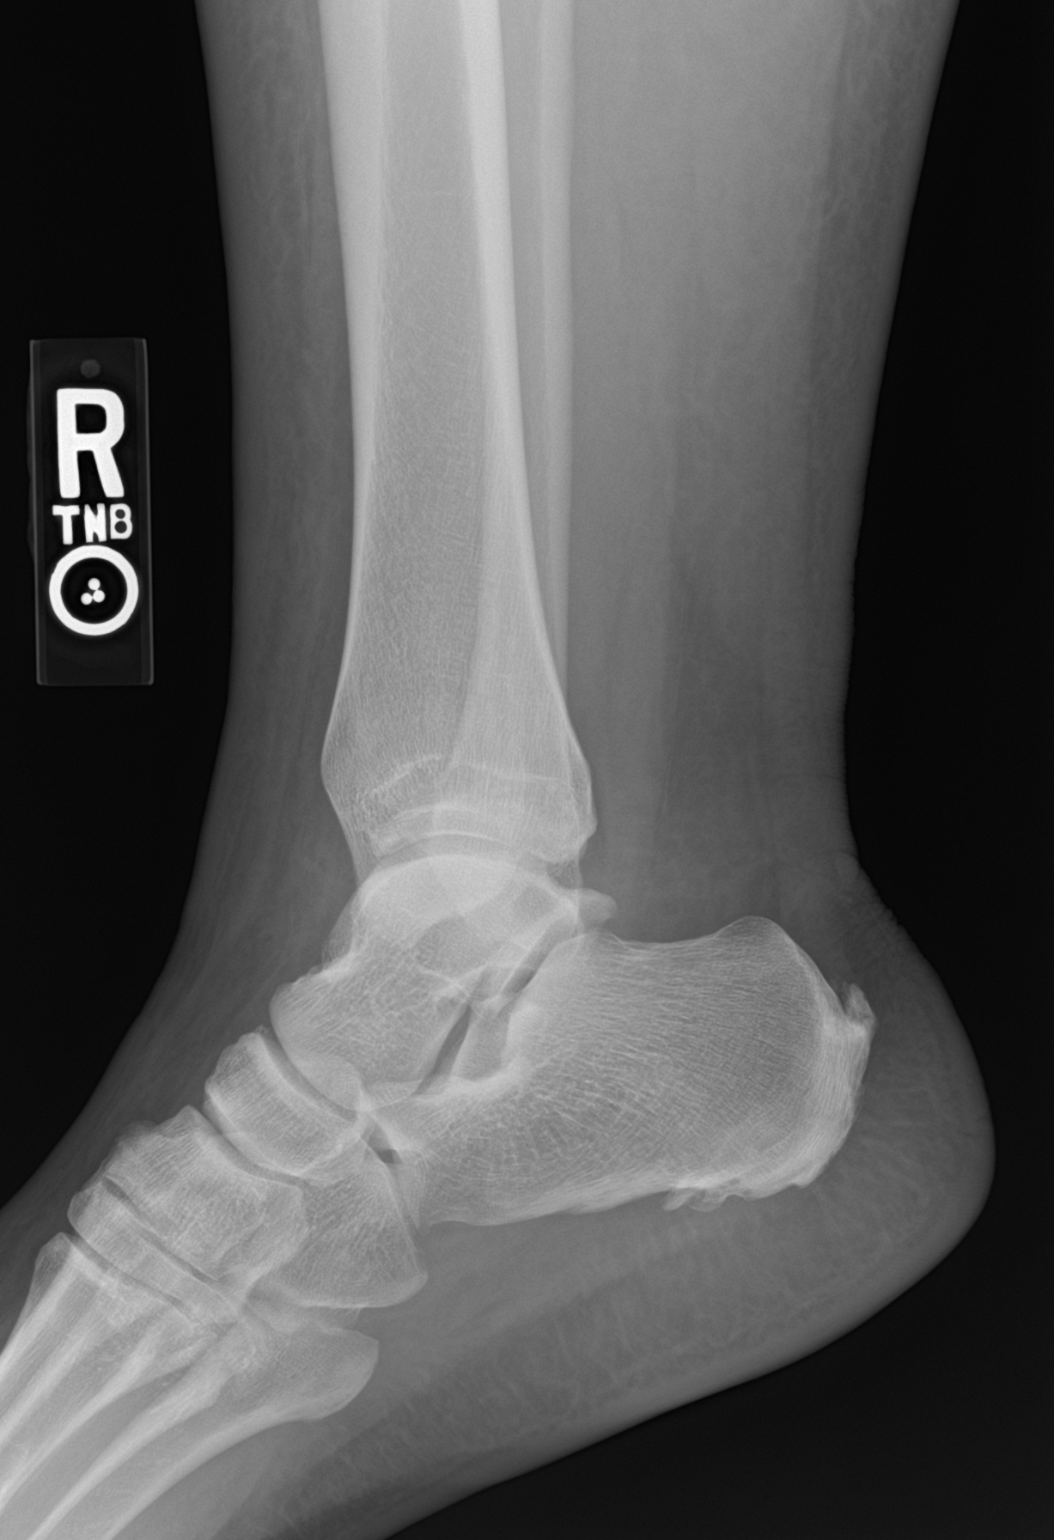

[3 of 3 positions shown; findings below may reference images not displayed]

FINDINGS: Frontal, oblique, and lateral views obtained. There is soft tissue
swelling, primarily medially. No fracture or joint effusion is
evident. There is no appreciable joint space narrowing or erosion.
No bony destruction. There are posterior and inferior calcaneal
spurs. Ankle mortise appears intact.
IMPRESSION: Soft tissue swelling, most notably medially. No evident fracture or
arthropathic change. No erosive change or bony destruction. Ankle
mortise appears intact. There are calcaneal spurs.

## 2021-01-11 ENCOUNTER — Encounter (HOSPITAL_COMMUNITY): Payer: Self-pay

## 2021-01-11 ENCOUNTER — Ambulatory Visit (HOSPITAL_COMMUNITY)
Admission: EM | Admit: 2021-01-11 | Discharge: 2021-01-11 | Disposition: A | Discharge status: Home / Self Care | Payer: 59

## 2021-01-11 ENCOUNTER — Other Ambulatory Visit: Payer: Self-pay

## 2021-01-11 DIAGNOSIS — R2232 Localized swelling, mass and lump, left upper limb: Secondary | ICD-10-CM

## 2021-01-11 DIAGNOSIS — L83 Acanthosis nigricans: Secondary | ICD-10-CM

## 2021-01-11 NOTE — ED Triage Notes (Signed)
Pt presents with rash around neck and abscess under left underarm X 2 weeks.

## 2021-01-11 NOTE — Discharge Instructions (Signed)
Next available appointment with Wendy Mayer is January 15, 2021 at 9:00 am.  Please make an appointment with her to schedule an ultrasound of likely lipoma and to address skin changes at neck which is likely acanthosis nigricans.

## 2021-01-11 NOTE — ED Provider Notes (Signed)
[facility]  ____________________________________________  Time seen: Approximately 11:16 AM  I have reviewed the triage vital signs and the nursing notes.   HISTORY  Chief Complaint Rash and Abscess   Historian Patient    HPI Wendy Mayer is a 35 y.o. female presents to the urgent care with 2 concerns.  Patient has a dark rash around her neck that has become more apparent to the years and she also has a soft, movable mass at left axilla without redness, fever or chills.  She states that mass has been present for the past several months.  She denies weight loss, weight gain or history of malignancy.  She does not have a primary care provider and states that she has not had routine labs in many years.   Past Medical History:  Diagnosis Date   Hypertension      Immunizations up to date:  Yes.     Past Medical History:  Diagnosis Date   Hypertension     Patient Active Problem List   Diagnosis Date Noted   Pain aggravated by activities of daily living 12/04/2013    Past Surgical History:  Procedure Laterality Date   HERNIA REPAIR      Prior to Admission medications   Medication Sig Start Date End Date Taking? Authorizing Provider  diphenhydrAMINE (BENADRYL) 25 MG tablet Take 1 tablet (25 mg total) by mouth every 6 (six) hours as needed. 01/28/19   Dahlia Byes A, NP  ibuprofen (ADVIL) 800 MG tablet TAKE 1 TABLET BY MOUTH EVERY 8 HOURS AS NEEDED 07/23/20   Brock Bad, MD  naproxen (NAPROSYN) 500 MG tablet Take 1 tablet (500 mg total) by mouth 2 (two) times daily. 07/31/18   Wieters, Hallie C, PA-C  permethrin (ELIMITE) 5 % cream Apply to affected area once 01/28/19   Bast, Traci A, NP  predniSONE (STERAPRED UNI-PAK 21 TAB) 10 MG (21) TBPK tablet 6 tabs for 1 day, then 5 tabs for 1 das, then 4 tabs for 1 day, then 3 tabs for 1 day, 2 tabs for 1 day, then 1 tab for 1 day 01/28/19   Dahlia Byes A, NP  Triamcinolone Acetonide (TRIAMCINOLONE 0.1 % CREAM  : EUCERIN) CREA Apply 1 application topically 2 (two) times daily. 07/31/18   Wieters, Hallie C, PA-C  triamcinolone cream (KENALOG) 0.1 % Apply 1 application topically 2 (two) times daily. 07/31/18   Wieters, Hallie C, PA-C    Allergies Patient has no known allergies.  Family History  Problem Relation Age of Onset   Hypertension Mother    Cancer Father     Social History Social History   Tobacco Use   Smoking status: Never   Smokeless tobacco: Never  Substance Use Topics   Alcohol use: Yes    Alcohol/week: 2.0 standard drinks    Types: 2 Standard drinks or equivalent per week   Drug use: No     Review of Systems  Constitutional: No fever/chills Eyes:  No discharge ENT: No upper respiratory complaints. Respiratory: no cough. No SOB/ use of accessory muscles to breath Gastrointestinal:   No nausea, no vomiting.  No diarrhea.  No constipation. Musculoskeletal: Negative for musculoskeletal pain. Skin: Patient has axillary mass.    ____________________________________________   PHYSICAL EXAM:  VITAL SIGNS: ED Triage Vitals  Enc Vitals Group     BP 01/11/21 1030 (!) 147/89     Pulse Rate 01/11/21 1030 69     Resp 01/11/21 1030 18  Temp 01/11/21 1030 98.5 F (36.9 C)     Temp Source 01/11/21 1030 Oral     SpO2 01/11/21 1030 100 %     Weight --      Height --      Head Circumference --      Peak Flow --      Pain Score 01/11/21 1034 5     Pain Loc --      Pain Edu? --      Excl. in [place]? --      Constitutional: Alert and oriented. Well appearing and in no acute distress. Eyes: Conjunctivae are normal. PERRL. EOMI. Head: Atraumatic. ENT:      Ears:       Nose: No congestion/rhinnorhea.      Mouth/Throat: Mucous membranes are moist.  Neck: No stridor.  Patient has acanthosis nigricans at neck. Cardiovascular: Normal rate, regular rhythm. Normal S1 and S2.  Good peripheral circulation. Respiratory: Normal respiratory effort without tachypnea or  retractions. Lungs CTAB. Good air entry to the bases with no decreased or absent breath sounds Gastrointestinal: Bowel sounds x 4 quadrants. Soft and nontender to palpation. No guarding or rigidity. No distention. Musculoskeletal: Full range of motion to all extremities. No obvious deformities noted Neurologic:  Normal for age. No gross focal neurologic deficits are appreciated.  Skin: Patient has a 4 cm x 3 cm soft, axillary mass consistent with a lipoma.  There is no overlying erythema or induration.  Patient has no tenderness with palpation. Psychiatric: Mood and affect are normal for age. Speech and behavior are normal.   ____________________________________________   LABS (all labs ordered are listed, but only abnormal results are displayed)  Labs Reviewed - No data to display ____________________________________________  EKG   ____________________________________________  RADIOLOGY   No results found.  ____________________________________________    PROCEDURES  Procedure(s) performed:     Procedures     Medications - No data to display   ____________________________________________   INITIAL IMPRESSION / ASSESSMENT AND PLAN / ED COURSE  Pertinent labs & imaging results that were available during my care of the patient were reviewed by me and considered in my medical decision making (see chart for details).      Assessment and plan Axillary mass Acanthosis nigricans  35 year old female presents to the urgent care with 2 medical concerns.  Rash at neck appears to resemble acanthosis nigricans left axillary mass is most consistent with a lipoma.  I did recommend the patient establish care with a primary care provider and next available appointment with [facility] health nurse practitioner was provided in patient's discharge paperwork.  Recommended routine labs to assess for diabetes and an ultrasound of the axillary mass to confirm benign lipoma.  Return  precautions were given to return with new or worsening symptoms.  All patient questions were answered.     ____________________________________________  FINAL CLINICAL IMPRESSION(S) / ED DIAGNOSES  Final diagnoses:  Acanthosis nigricans  Mass of left axilla      NEW MEDICATIONS STARTED DURING THIS VISIT:  ED Discharge Orders     None           This chart was dictated using voice recognition software/Dragon. Despite best efforts to proofread, errors can occur which can change the meaning. Any change was purely unintentional.     Orvil Feil, PA-C 01/11/21 1119

## 2021-02-04 ENCOUNTER — Ambulatory Visit: Payer: Self-pay | Admitting: Nurse Practitioner

## 2021-08-28 ENCOUNTER — Other Ambulatory Visit: Payer: Self-pay | Admitting: Obstetrics

## 2021-11-29 ENCOUNTER — Encounter (HOSPITAL_COMMUNITY): Payer: Self-pay | Admitting: Emergency Medicine

## 2021-11-29 ENCOUNTER — Ambulatory Visit (HOSPITAL_COMMUNITY)
Admission: EM | Admit: 2021-11-29 | Discharge: 2021-11-29 | Disposition: A | Discharge status: Home / Self Care | Payer: Commercial Managed Care - HMO | Attending: Internal Medicine | Admitting: Internal Medicine

## 2021-11-29 DIAGNOSIS — S46812A Strain of other muscles, fascia and tendons at shoulder and upper arm level, left arm, initial encounter: Secondary | ICD-10-CM

## 2021-11-29 DIAGNOSIS — I10 Essential (primary) hypertension: Secondary | ICD-10-CM | POA: Diagnosis not present

## 2021-11-29 MED ORDER — METHOCARBAMOL 500 MG PO TABS: 500.0000 mg | ORAL_TABLET | Freq: Three times a day (TID) | ORAL | 0 refills | Status: DC | PRN

## 2021-11-29 NOTE — ED Provider Notes (Signed)
[facility]    CSN: [id number] Arrival date & time: 11/29/21  1011      History   Chief Complaint Chief Complaint  Patient presents with   Neck Pain   Shoulder Pain   Wrist Pain    HPI Wendy Mayer is a 36 y.o. female with past medical history of high blood pressure presents to urgent care today with 1 month history of left shoulder and neck pain.  Patient reports stiffness and feeling need to "pop" shoulder.  Pain seems worse in the evening when she is lying on affected side.  She denies any known trauma, no popping or locking sensation.  Patient taking frequent Aleve with some relief.  She denies any chest pain, headache, dizziness, shortness of breath, palpitations.  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   Past Medical History:  Diagnosis Date   Hypertension     Patient Active Problem List   Diagnosis Date Noted   Pain aggravated by activities of daily living 12/04/2013    Past Surgical History:  Procedure Laterality Date   HERNIA REPAIR      OB History     Gravida  3   Para  3   Term  3   Preterm      AB      Living  3      SAB      IAB      Ectopic      Multiple      Live Births  3            Home Medications    Prior to Admission medications   Medication Sig Start Date End Date Taking? Authorizing Provider  methocarbamol (ROBAXIN) 500 MG tablet Take 1 tablet (500 mg total) by mouth every 8 (eight) hours as needed for muscle spasms. 11/29/21  Yes Rudolpho Sevin, NP  diphenhydrAMINE (BENADRYL) 25 MG tablet Take 1 tablet (25 mg total) by mouth every 6 (six) hours as needed. 01/28/19   Loura Halt A, NP  ibuprofen (ADVIL) 800 MG tablet TAKE 1 TABLET BY MOUTH EVERY 8 HOURS AS NEEDED 08/29/21   Chancy Milroy, MD  naproxen (NAPROSYN) 500 MG tablet Take 1 tablet (500 mg total) by mouth 2 (two) times daily. 07/31/18   Wieters, Hallie C, PA-C  permethrin (ELIMITE) 5 % cream Apply to affected  area once 01/28/19   Bast, Traci A, NP  predniSONE (STERAPRED UNI-PAK 21 TAB) 10 MG (21) TBPK tablet 6 tabs for 1 day, then 5 tabs for 1 das, then 4 tabs for 1 day, then 3 tabs for 1 day, 2 tabs for 1 day, then 1 tab for 1 day 01/28/19   Loura Halt A, NP  Triamcinolone Acetonide (TRIAMCINOLONE 0.1 % CREAM : EUCERIN) CREA Apply 1 application topically 2 (two) times daily. 07/31/18   Wieters, Hallie C, PA-C  triamcinolone cream (KENALOG) 0.1 % Apply 1 application topically 2 (two) times daily. 07/31/18   Wieters, Elesa Hacker, PA-C    Family History Family History  Problem Relation Age of Onset   Hypertension Mother    Cancer Father     Social History Social History   Tobacco Use   Smoking status: Never   Smokeless tobacco: Never  Substance Use Topics   Alcohol use: Yes    Alcohol/week: 2.0 standard drinks of alcohol    Types: 2 Standard drinks or equivalent per week   Drug use: No  Allergies   Patient has no known allergies.   Review of Systems As stated in HPI otherwise negative   Physical Exam Triage Vital Signs ED Triage Vitals  Enc Vitals Group     BP 11/29/21 1108 (!) 148/89     Pulse Rate 11/29/21 1108 65     Resp 11/29/21 1108 18     Temp 11/29/21 1108 98.6 F (37 C)     Temp Source 11/29/21 1108 Oral     SpO2 11/29/21 1108 96 %     Weight --      Height --      Head Circumference --      Peak Flow --      Pain Score 11/29/21 1107 5     Pain Loc --      Pain Edu? --      Excl. in [place]? --    No data found.  Updated Vital Signs BP (!) 148/89 (BP Location: Left Wrist)   Pulse 65   Temp 98.6 F (37 C) (Oral)   Resp 18   SpO2 96%   Visual Acuity Right Eye Distance:   Left Eye Distance:   Bilateral Distance:    Right Eye Near:   Left Eye Near:    Bilateral Near:     Physical Exam Constitutional:      General: She is not in acute distress.    Appearance: Normal appearance. She is obese. She is not ill-appearing or toxic-appearing.   Cardiovascular:     Rate and Rhythm: Normal rate and regular rhythm.     Heart sounds: No murmur heard.    No friction rub. No gallop.  Pulmonary:     Effort: Pulmonary effort is normal.     Breath sounds: Normal breath sounds.  Musculoskeletal:        General: Tenderness present. No swelling, deformity or signs of injury. Normal range of motion.     Cervical back: Normal range of motion and neck supple. No rigidity or tenderness.  Lymphadenopathy:     Cervical: No cervical adenopathy.  Skin:    General: Skin is warm and dry.     Findings: No rash.     Comments: Patient with palpable tension in left trapezius muscle with TTP.  Left shoulder with normal range of motion, no crepitus or popping of the joint  Neurological:     Mental Status: She is alert.      UC Treatments / Results  Labs (all labs ordered are listed, but only abnormal results are displayed) Labs Reviewed - No data to display  EKG   Radiology No results found.  Procedures Procedures (including critical care time)  Medications Ordered in UC Medications - No data to display  Initial Impression / Assessment and Plan / UC Course  I have reviewed the triage vital signs and the nursing notes.  Pertinent labs & imaging results that were available during my care of the patient were reviewed by me and considered in my medical decision making (see chart for details).  Left shoulder/neck pain -Symptoms persistent x1 month with reproducible tenderness in the left trapezius muscle consistent with muscle strain.  No evidence of frozen shoulder or any joint instability.  No cardiac symptoms -Avoid NSAIDs due to longstanding untreated hypertension -Heat, light stretching, Robaxin as needed  Hypertension -Noted at previous visits and again at today's visit -No red flag symptoms -Long discussion regarding long-term effects of uncontrolled hypertension and need to establish with PCP.  Patient  verifies understanding.   We will ask for PCP assistance  Final Clinical Impressions(s) / UC Diagnoses   Final diagnoses:  Strain of left trapezius muscle, initial encounter  Hypertension, unspecified type     Discharge Instructions      As we discussed, I believe your discomfort is coming from muscle tension in your trapezius muscle.  Try to avoid using Aleve or ibuprofen given your higher blood pressure today.  Use heating pad as discussed and light stretching.  I have prescribed a muscle relaxer to use as needed.  Please avoid driving prior to taking medication as it may make you sleepy.  It appears your blood pressure has been elevated the past few times you have been seen.  I have asked our office to reach out to you to help you establish a primary care provider to help manage this.  Please return for any persistent or worsening symptoms.     ED Prescriptions     Medication Sig Dispense Auth. Provider   methocarbamol (ROBAXIN) 500 MG tablet Take 1 tablet (500 mg total) by mouth every 8 (eight) hours as needed for muscle spasms. 30 tablet Rolla Etienne, NP      PDMP not reviewed this encounter.   Rolla Etienne, NP 11/29/21 1159

## 2021-11-29 NOTE — Discharge Instructions (Addendum)
As we discussed, I believe your discomfort is coming from muscle tension in your trapezius muscle.  Try to avoid using Aleve or ibuprofen given your higher blood pressure today.  Use heating pad as discussed and light stretching.  I have prescribed a muscle relaxer to use as needed.  Please avoid driving prior to taking medication as it may make you sleepy.  It appears your blood pressure has been elevated the past few times you have been seen.  I have asked our office to reach out to you to help you establish a primary care provider to help manage this.  Please return for any persistent or worsening symptoms.

## 2021-11-29 NOTE — ED Triage Notes (Signed)
Pt reports left side neck pain, left shoulder pain and left wrist pain x 1 month. States pain increases with movement and has been getting worse. Has been taking aleve for pain relief.

## 2021-12-15 ENCOUNTER — Other Ambulatory Visit: Payer: Self-pay

## 2021-12-15 ENCOUNTER — Ambulatory Visit (INDEPENDENT_AMBULATORY_CARE_PROVIDER_SITE_OTHER): Payer: Commercial Managed Care - HMO | Admitting: Student

## 2021-12-15 ENCOUNTER — Ambulatory Visit (HOSPITAL_COMMUNITY)
Admission: RE | Admit: 2021-12-15 | Discharge: 2021-12-15 | Disposition: A | Discharge status: Home / Self Care | Payer: Commercial Managed Care - HMO | Source: Ambulatory Visit | Attending: Internal Medicine | Admitting: Internal Medicine

## 2021-12-15 ENCOUNTER — Encounter: Payer: Self-pay | Admitting: Student

## 2021-12-15 VITALS — BP 133/84 | HR 70 | Temp 98.5°F | Ht 67.0 in | Wt 356.9 lb

## 2021-12-15 DIAGNOSIS — Z131 Encounter for screening for diabetes mellitus: Secondary | ICD-10-CM | POA: Diagnosis not present

## 2021-12-15 DIAGNOSIS — I10 Essential (primary) hypertension: Secondary | ICD-10-CM | POA: Diagnosis not present

## 2021-12-15 DIAGNOSIS — Z0000 Encounter for general adult medical examination without abnormal findings: Secondary | ICD-10-CM | POA: Insufficient documentation

## 2021-12-15 DIAGNOSIS — M25512 Pain in left shoulder: Secondary | ICD-10-CM | POA: Insufficient documentation

## 2021-12-15 DIAGNOSIS — Z9189 Other specified personal risk factors, not elsewhere classified: Secondary | ICD-10-CM | POA: Insufficient documentation

## 2021-12-15 DIAGNOSIS — Z6841 Body Mass Index (BMI) 40.0 and over, adult: Secondary | ICD-10-CM

## 2021-12-15 DIAGNOSIS — D509 Iron deficiency anemia, unspecified: Secondary | ICD-10-CM | POA: Insufficient documentation

## 2021-12-15 DIAGNOSIS — Z862 Personal history of diseases of the blood and blood-forming organs and certain disorders involving the immune mechanism: Secondary | ICD-10-CM

## 2021-12-15 HISTORY — DX: Pain in left shoulder: M25.512

## 2021-12-15 LAB — GLUCOSE, CAPILLARY: Glucose-Capillary: 82 mg/dL (ref 70–99)

## 2021-12-15 LAB — POCT GLYCOSYLATED HEMOGLOBIN (HGB A1C): Hemoglobin A1C: 5.5 % (ref 4.0–5.6)

## 2021-12-15 NOTE — Assessment & Plan Note (Signed)
Last CBC in 2009 showed microcytic anemia. No history of IDA. Will recheck CBC today.

## 2021-12-15 NOTE — Progress Notes (Signed)
CC: Establish care, urgent care follow-up  HPI:  [name] is a 36 y.o. female living with a history stated below and presents today for establish care and urgent care follow-up. Please see problem based assessment and plan for additional details.  PMH:  -patient denies any past medical problems  PSH: -patient denies any past surgeries  Meds: -currently taking Robaxin 500 mg daily -ibuprofen as needed  FH: -mother has HTN and CHF -3 sons that are healthy  SH: -0.25 ppd x 17 years -denies alcohol or illicit drug use -works as Public librarian at [employer] -lives in [place] with children  Review of Systems: ROS negative except for what is noted on the assessment and plan.  Vitals:   12/15/21 1017 12/15/21 1051  BP: (!) 148/83 133/84  Pulse: 81 70  Temp: 98.5 F (36.9 C)   TempSrc: Oral   SpO2: 100%   Weight: (!) 356 lb 14.4 oz (161.9 kg)   Height: 5\' 7"  (1.702 m)    Physical Exam: Constitutional: patient with obesity, sitting in chair, in no acute distress HENT: normocephalic atraumatic Neck: supple Cardiovascular: regular rate and rhythm, no m/r/g Pulmonary/Chest: normal work of breathing on room air MSK:  Left Shoulder: no swelling, lesions or deformity, tenderness along superior aspect, pain with ROM Neurological: alert & oriented x 3 Skin: warm and dry Psych: normal mood and behavior  Assessment & Plan:   Primary hypertension BP Readings from Last 3 Encounters:  12/15/21 133/84  11/29/21 (!) 148/89  01/11/21 (!) 147/89   Repeat BP today improved to 133/84. Not on any antihypertensives. She is asymptomatic today. She is making dietary changes for weight loss. Encouraged her to continue and to limit her sodium intake. Provided information regarding DASH diet. We will continue with lifestyle changes for her HTN.   Plan -continue lifestyle modifications -reassess at next visit, consider starting antihypertensive if BP persistently elevated  Class  3 severe obesity with body mass index (BMI) of 50.0 to 59.9 in adult [facility]) Body mass index is 55.9 kg/m. Patient has been trying to lose weight with lifestyle changes. Trying to decrease sugar intake and increase more vegetables and fruits. She has a job that requires her to be on her feet and active. [facility].   Plan -referral to P.R.E.P -CMP today -A1c today  Health care maintenance -Screening for diabetes today -Deferred Pap smear to future office visit -She will obtain flu vaccine at her workplace  At risk for sleep apnea Patient reports snoring and daytime fatigue. She sleeps with her boyfriend but does not recall him mentioning any episodes of apnea. Previous CT neck showed enlarged tonsils. Her STOP-BANG score suggests increased risk for moderate-severe OSA. Will need sleep study evaluation given increased risk.   Plan -home sleep study   Left shoulder pain Patient presents with left shoulder pain for past 5-6 months. Pain is intermittent and localized to left shoulder. Denies any neck pain. She does not recall any injuries or trauma. Has tried ibuprofen and given prescription for robaxin by urgent care. Her job involves lifting heavy items and at times overhead lifting. Exam today showed no significant swelling, erythema or deformity. Pain with ROM. Patient does not have any other joint pains. Differentials include arthritis, muscle strain, tendinopathy or adhesive capsulitis. Will obtain imaging now and continue supportive management.   Plan -Xray of left shoulder -Advised patient can take tylenol and  ibuprofen as needed for the pain  History of anemia Last CBC in 2009 showed microcytic anemia. No history of IDA. Will recheck CBC today.    Patient seen with Dr. Layne Benton, D.O. [facility], PGY-1 Phone: [phone] Date 12/15/2021 Time 8:00 PM

## 2021-12-15 NOTE — Patient Instructions (Addendum)
Thank you, Ms.[name] for allowing [place] to provide your care today.   High Blood Pressure -Repeat blood pressure improved -We discussed reducing daily sodium intake to less than 2 grams.  -Continue with your dietary changes  Weight Loss -Continue your dietary changes -provider [facility] sent  -sent order for at home sleep study for concern of sleep apnea   Left Shoulder Pain -You can take tylenol and Aleve for your pain -Xray of left shoulder  -Blood work to check blood count, electrolytes, kidney function and screen for diabetes.  I have ordered the following labs for you:  Lab Orders         CMP14 + Anion Gap         CBC no Diff         POC Hbg A1C       Referrals ordered today:   Referral Orders         Amb Referral To Provider Referral Exercise Program (P.R.E.P)       I have ordered the following medication/changed the following medications:   Stop the following medications: Medications Discontinued During This Encounter  Medication Reason   diphenhydrAMINE (BENADRYL) 25 MG tablet    permethrin (ELIMITE) 5 % cream    predniSONE (STERAPRED UNI-PAK 21 TAB) 10 MG (21) TBPK tablet    Triamcinolone Acetonide (TRIAMCINOLONE 0.1 % CREAM : EUCERIN) CREA    triamcinolone cream (KENALOG) 0.1 %    naproxen (NAPROSYN) 500 MG tablet      Start the following medications: No orders of the defined types were placed in this encounter.    Follow up: 2-3 months    Should you have any questions or concerns please call the internal medicine clinic at [phone].    Rana Snare, D.O. [facility]

## 2021-12-15 NOTE — Assessment & Plan Note (Addendum)
BP Readings from Last 3 Encounters:  12/15/21 133/84  11/29/21 (!) 148/89  01/11/21 (!) 147/89   Repeat BP today improved to 133/84. Not on any antihypertensives. She is asymptomatic today. She is making dietary changes for weight loss. Encouraged her to continue and to limit her sodium intake. Provided information regarding DASH diet. We will continue with lifestyle changes for her HTN.   Plan -continue lifestyle modifications -reassess at next visit, consider starting antihypertensive if BP persistently elevated

## 2021-12-15 NOTE — Assessment & Plan Note (Signed)
-  Screening for diabetes today -Deferred Pap smear to future office visit -She will obtain flu vaccine at her workplace

## 2021-12-15 NOTE — Assessment & Plan Note (Addendum)
Patient reports snoring and daytime fatigue. She sleeps with her boyfriend but does not recall him mentioning any episodes of apnea. Previous CT neck showed enlarged tonsils. Her STOP-BANG score suggests increased risk for moderate-severe OSA. Will need sleep study evaluation given increased risk.   Plan -home sleep study

## 2021-12-15 NOTE — Assessment & Plan Note (Addendum)
Patient presents with left shoulder pain for past 5-6 months. Pain is intermittent and localized to left shoulder. Denies any neck pain. She does not recall any injuries or trauma. Has tried ibuprofen and given prescription for robaxin by urgent care. Her job involves lifting heavy items and at times overhead lifting. Exam today showed no significant swelling, erythema or deformity. Pain with ROM. Patient does not have any other joint pains. Differentials include arthritis, muscle strain, tendinopathy or adhesive capsulitis. Will obtain imaging now and continue supportive management.   Plan -Xray of left shoulder -Advised patient can take tylenol and ibuprofen as needed for the pain

## 2021-12-15 NOTE — Assessment & Plan Note (Signed)
Body mass index is 55.9 kg/m. Patient has been trying to lose weight with lifestyle changes. Trying to decrease sugar intake and increase more vegetables and fruits. She has a job that requires her to be on her feet and active. [facility] with [facility] in [facility].   Plan -referral to P.R.E.P -CMP today -A1c today

## 2021-12-16 LAB — CMP14 + ANION GAP
ALT: 7 IU/L (ref 0–32)
AST: 12 IU/L (ref 0–40)
Albumin/Globulin Ratio: 1.1 — ABNORMAL LOW (ref 1.2–2.2)
Albumin: 3.8 g/dL — ABNORMAL LOW (ref 3.9–4.9)
Alkaline Phosphatase: 73 IU/L (ref 44–121)
Anion Gap: 14 mmol/L (ref 10.0–18.0)
BUN/Creatinine Ratio: 17 (ref 9–23)
BUN: 11 mg/dL (ref 6–20)
Bilirubin Total: <0.2 mg/dL (ref 0.0–1.2)
CO2: 23 mmol/L (ref 20–29)
Calcium: 9.1 mg/dL (ref 8.7–10.2)
Chloride: 105 mmol/L (ref 96–106)
Creatinine, Ser: 0.65 mg/dL (ref 0.57–1.00)
Globulin, Total: 3.4 g/dL (ref 1.5–4.5)
Glucose: 82 mg/dL (ref 70–99)
Potassium: 4.3 mmol/L (ref 3.5–5.2)
Sodium: 142 mmol/L (ref 134–144)
Total Protein: 7.2 g/dL (ref 6.0–8.5)
eGFR: 116 mL/min/{1.73_m2} (ref >59)

## 2021-12-16 LAB — CBC
Hematocrit: 33.3 % — ABNORMAL LOW (ref 34.0–46.6)
Hemoglobin: 10 g/dL — ABNORMAL LOW (ref 11.1–15.9)
MCH: 22 pg — ABNORMAL LOW (ref 26.6–33.0)
MCHC: 30 g/dL — ABNORMAL LOW (ref 31.5–35.7)
MCV: 73 fL — ABNORMAL LOW (ref 79–97)
Platelets: 233 10*3/uL (ref 150–450)
RBC: 4.54 x10E6/uL (ref 3.77–5.28)
RDW: 16.2 % — ABNORMAL HIGH (ref 11.7–15.4)
WBC: 6.6 10*3/uL (ref 3.4–10.8)

## 2021-12-18 ENCOUNTER — Telehealth: Payer: Self-pay

## 2021-12-18 NOTE — Telephone Encounter (Signed)
Call to pt reference PREP referral  Explained program Interested in participating Works [id number]-8p Needs an early morning class. Closest to [place] Will call her when next early class is scheduled

## 2021-12-22 NOTE — Progress Notes (Signed)
Internal Medicine Clinic Attending  I saw and evaluated the patient.  I personally confirmed the key portions of the history and exam documented by Dr. Zheng and I reviewed pertinent patient test results.  The assessment, diagnosis, and plan were formulated together and I agree with the documentation in the resident's note.  

## 2022-03-17 ENCOUNTER — Encounter: Payer: Commercial Managed Care - HMO | Admitting: Internal Medicine

## 2022-03-17 NOTE — Progress Notes (Deleted)
   CC: 3 month follow up  HPI:  [name] is a 37 y.o. with medical history of HTN, Class III obesity presenting to [facility] for follow up.   Please see problem-based list for further details, assessments, and plans.  Past Medical History:  Diagnosis Date   Hypertension        Current Outpatient Medications (Analgesics):    ibuprofen (ADVIL) 800 MG tablet, TAKE 1 TABLET BY MOUTH EVERY 8 HOURS AS NEEDED   Current Outpatient Medications (Other):    methocarbamol (ROBAXIN) 500 MG tablet, Take 1 tablet (500 mg total) by mouth every 8 (eight) hours as needed for muscle spasms.  Review of Systems:  Review of system negative unless stated in the problem list or HPI.    Physical Exam:  There were no vitals filed for this visit.  Physical Exam General: NAD HENT: NCAT Lungs: CTAB, no wheeze, rhonchi or rales.  Cardiovascular: Normal heart sounds, no r/m/g, 2+ pulses in all extremities. No LE edema Abdomen: No TTP, normal bowel sounds MSK: No asymmetry or muscle atrophy.  Skin: no lesions noted on exposed skin Neuro: Alert and oriented x4. CN grossly intact Psych: Normal mood and normal affect   Assessment & Plan:   No problem-specific Assessment & Plan notes found for this encounter.   See Encounters Tab for problem based charting.  Patient discussed with Dr. {NAMES:[id number]::"Wendy Mayer","Wendy Mayer","Wendy Mayer","Wendy Mayer","Wendy Mayer","Wendy Mayer","Wendy Mayer","Wendy Mayer"} Wendy Schuller, MD Wendy Mayer. [facility] Internal Medicine Residency, PGY-2   HTN Lifestyle controlled.   Class III Obesity

## 2022-04-08 ENCOUNTER — Encounter (HOSPITAL_COMMUNITY): Payer: Self-pay | Admitting: Emergency Medicine

## 2022-04-08 ENCOUNTER — Ambulatory Visit (HOSPITAL_COMMUNITY)
Admission: EM | Admit: 2022-04-08 | Discharge: 2022-04-08 | Disposition: A | Discharge status: Home / Self Care | Payer: Commercial Managed Care - HMO | Attending: Internal Medicine | Admitting: Internal Medicine

## 2022-04-08 DIAGNOSIS — R21 Rash and other nonspecific skin eruption: Secondary | ICD-10-CM

## 2022-04-08 DIAGNOSIS — L308 Other specified dermatitis: Secondary | ICD-10-CM

## 2022-04-08 MED ORDER — CLOTRIMAZOLE 1 % EX CREA: TOPICAL_CREAM | CUTANEOUS | 0 refills | Status: AC

## 2022-04-08 NOTE — Discharge Instructions (Addendum)
Apply Lotrimin cream to your rash twice daily for the next 7 days as I believe that this is a fungal rash.  Avoid excessive moisture to the area and keep the area dry.  You may also benefit from putting baby powder to this area to keep it nice and dry.  If your rash does not improve in the next 3 to 4 days with using the Lotrimin antifungal cream, please return to urgent care or follow-up with your primary care provider for further evaluation.  I hope you feel better!

## 2022-04-08 NOTE — ED Triage Notes (Signed)
Has rash around neck for a week. Tried putting lotion on it. Reports itches.

## 2022-04-08 NOTE — ED Provider Notes (Signed)
[place]    CSN: [id number] Arrival date & time: 04/08/22  0805      History   Chief Complaint Chief Complaint  Patient presents with   Rash    HPI Wendy Mayer is a 37 y.o. female.   Patient presents urgent care for evaluation of pruritic rash to the anterior neck and upper chest that started 1 week ago.  States that the rash is skin toned and denies erythema or drainage to the rash.  No recent changes in perfumes, lotions, powders, or laundry detergents/personal hygiene products.  She does not apply make-up to the anterior neck.  No recent exposure to poisonous plants or known allergens.  The rash is slightly itchy, however she denies hives.  No sensations of throat closure, sore throat, fever/chills, or warmth to the rash.  The rash has remained localized to the anterior neck and has not spread to the arms, back, or abdomen.  She is not diabetic and denies recent oral antibiotic or steroid use.  She has been putting antibiotic ointment on the rash in attempt to keep it moist as the rash has been dry.      Past Medical History:  Diagnosis Date   Hypertension     Patient Active Problem List   Diagnosis Date Noted   Left shoulder pain 12/15/2021   Primary hypertension 12/15/2021   Class 3 severe obesity with body mass index (BMI) of 50.0 to 59.9 in adult [facility]) 12/15/2021   At risk for sleep apnea 12/15/2021   Health care maintenance 12/15/2021   History of anemia 12/15/2021   Pain aggravated by activities of daily living 12/04/2013    Past Surgical History:  Procedure Laterality Date   HERNIA REPAIR      OB History     Gravida  3   Para  3   Term  3   Preterm      AB      Living  3      SAB      IAB      Ectopic      Multiple      Live Births  3            Home Medications    Prior to Admission medications   Medication Sig Start Date End Date Taking? Authorizing Provider  clotrimazole (LOTRIMIN) 1 % cream Apply to  affected area 2 times daily for 7 days. 04/08/22  Yes Talbot Grumbling, FNP  ibuprofen (ADVIL) 800 MG tablet TAKE 1 TABLET BY MOUTH EVERY 8 HOURS AS NEEDED 08/29/21   Chancy Milroy, MD  methocarbamol (ROBAXIN) 500 MG tablet Take 1 tablet (500 mg total) by mouth every 8 (eight) hours as needed for muscle spasms. 11/29/21   Rudolpho Sevin, NP    Family History Family History  Problem Relation Age of Onset   Hypertension Mother    Cancer Father     Social History Social History   Tobacco Use   Smoking status: Never   Smokeless tobacco: Never  Substance Use Topics   Alcohol use: Yes    Alcohol/week: 2.0 standard drinks of alcohol    Types: 2 Standard drinks or equivalent per week   Drug use: No     Allergies   Patient has no known allergies.   Review of Systems Review of Systems Per HPI  Physical Exam Triage Vital Signs ED Triage Vitals [04/08/22 0840]  Enc Vitals Group  BP (!) 149/96     Pulse Rate 68     Resp 18     Temp 98.1 F (36.7 C)     Temp Source Oral     SpO2 97 %     Weight      Height      Head Circumference      Peak Flow      Pain Score 0     Pain Loc      Pain Edu?      Excl. in [place]?    No data found.  Updated Vital Signs BP (!) 149/96 (BP Location: Left Arm)   Pulse 68   Temp 98.1 F (36.7 C) (Oral)   Resp 18   LMP 03/19/2022   SpO2 97%   Visual Acuity Right Eye Distance:   Left Eye Distance:   Bilateral Distance:    Right Eye Near:   Left Eye Near:    Bilateral Near:     Physical Exam Vitals and nursing note reviewed.  Constitutional:      Appearance: She is obese. She is not ill-appearing or toxic-appearing.  HENT:     Head: Normocephalic and atraumatic.     Right Ear: Hearing, tympanic membrane, ear canal and external ear normal.     Left Ear: Hearing, tympanic membrane, ear canal and external ear normal.     Nose: Nose normal.     Mouth/Throat:     Lips: Pink.     Mouth: Mucous membranes are moist. No injury.      Tongue: No lesions. Tongue does not deviate from midline.     Palate: No mass and lesions.     Pharynx: Oropharynx is clear. Uvula midline. No pharyngeal swelling, oropharyngeal exudate, posterior oropharyngeal erythema or uvula swelling.     Tonsils: No tonsillar exudate or tonsillar abscesses.  Eyes:     General: Lids are normal. Vision grossly intact. Gaze aligned appropriately.     Extraocular Movements: Extraocular movements intact.     Conjunctiva/sclera: Conjunctivae normal.  Neck:     Trachea: Trachea and phonation normal.     Comments: Pruritic hyperpigmented macular rash without warmth, erythema, or drainage to the anterior cervical creases as seen in image below. No signs of excoriation.  Pulmonary:     Effort: Pulmonary effort is normal.  Musculoskeletal:     Cervical back: Full passive range of motion without pain, normal range of motion and neck supple.  Lymphadenopathy:     Cervical: No cervical adenopathy.  Skin:    General: Skin is warm and dry.     Capillary Refill: Capillary refill takes less than 2 seconds.     Findings: Rash present.     Comments: Rash to the anterior cervical creases spreading to the upper chest wall bilaterally. No rash to the lower chest wall, abdomen, back, arms, or face. Some lesions present to the posterior cervical creases. No urticaria.   Neurological:     General: No focal deficit present.     Mental Status: She is alert and oriented to person, place, and time. Mental status is at baseline.     Cranial Nerves: No dysarthria or facial asymmetry.  Psychiatric:        Mood and Affect: Mood normal.        Speech: Speech normal.        Behavior: Behavior normal.        Thought Content: Thought content normal.  Judgment: Judgment normal.      UC Treatments / Results  Labs (all labs ordered are listed, but only abnormal results are displayed) Labs Reviewed - No data to display  EKG   Radiology No results  found.  Procedures Procedures (including critical care time)  Medications Ordered in UC Medications - No data to display  Initial Impression / Assessment and Plan / UC Course  I have reviewed the triage vital signs and the nursing notes.  Pertinent labs & imaging results that were available during my care of the patient were reviewed by me and considered in my medical decision making (see chart for details).   1. Rash and nonspecific skin eruption, pruritic dermatitis Rash is consistent with candidal etiology that will likely respond well to Lotrimin cream twice daily for the next 7 days.  Advised to keep the area dry, she may apply baby powder as needed as well to help with this and prevent excessive moisture buildup.  If rash fails to improve in the next 3 to 4 days with use of Lotrimin cream, advised to follow-up with urgent care or PCP for further evaluation.   Discussed physical exam and available lab work findings in clinic with patient.  Counseled patient regarding appropriate use of medications and potential side effects for all medications recommended or prescribed today. Discussed red flag signs and symptoms of worsening condition,when to call the PCP office, return to urgent care, and when to seek higher level of care in the emergency department. Patient verbalizes understanding and agreement with plan. All questions answered. Patient discharged in stable condition.    Final Clinical Impressions(s) / UC Diagnoses   Final diagnoses:  Rash and nonspecific skin eruption  Pruritic dermatitis     Discharge Instructions      Apply Lotrimin cream to your rash twice daily for the next 7 days as I believe that this is a fungal rash.  Avoid excessive moisture to the area and keep the area dry.  You may also benefit from putting baby powder to this area to keep it nice and dry.  If your rash does not improve in the next 3 to 4 days with using the Lotrimin antifungal cream, please  return to urgent care or follow-up with your primary care provider for further evaluation.  I hope you feel better!      ED Prescriptions     Medication Sig Dispense Auth. Provider   clotrimazole (LOTRIMIN) 1 % cream Apply to affected area 2 times daily for 7 days. 15 g Talbot Grumbling, FNP      PDMP not reviewed this encounter.   Talbot Grumbling, [place] 04/08/22 [phone]

## 2023-01-21 ENCOUNTER — Ambulatory Visit (INDEPENDENT_AMBULATORY_CARE_PROVIDER_SITE_OTHER): Payer: 59 | Admitting: Student

## 2023-01-21 ENCOUNTER — Telehealth: Payer: Self-pay | Admitting: *Deleted

## 2023-01-21 ENCOUNTER — Other Ambulatory Visit: Payer: Self-pay | Admitting: Internal Medicine

## 2023-01-21 VITALS — BP 139/88 | HR 85 | Ht 67.0 in | Wt 354.2 lb

## 2023-01-21 DIAGNOSIS — Z131 Encounter for screening for diabetes mellitus: Secondary | ICD-10-CM

## 2023-01-21 DIAGNOSIS — R2233 Localized swelling, mass and lump, upper limb, bilateral: Secondary | ICD-10-CM | POA: Diagnosis not present

## 2023-01-21 DIAGNOSIS — Z862 Personal history of diseases of the blood and blood-forming organs and certain disorders involving the immune mechanism: Secondary | ICD-10-CM

## 2023-01-21 DIAGNOSIS — D509 Iron deficiency anemia, unspecified: Secondary | ICD-10-CM | POA: Diagnosis not present

## 2023-01-21 DIAGNOSIS — I10 Essential (primary) hypertension: Secondary | ICD-10-CM

## 2023-01-21 DIAGNOSIS — Z0000 Encounter for general adult medical examination without abnormal findings: Secondary | ICD-10-CM

## 2023-01-21 DIAGNOSIS — Z124 Encounter for screening for malignant neoplasm of cervix: Secondary | ICD-10-CM

## 2023-01-21 NOTE — Assessment & Plan Note (Addendum)
Reports noticing bilateral axillary masses about 1 year ago.  States masses have not grown in size and are stable.  Not tender to touch but irritable from patient's perspective since she notices them. Denies any breast tenderness or new masses/lesions. No history of prior mammogram. Denies any B symptoms. Denies any history of cancer or family history of cancer. Breast exam with chaperone present showed small pea sized mobile lesion under right axillary but no discrete masses over breasts or left axillary. No obvious rashes/skin lesions under armpits.   Order breast/axillary ultrasound bilaterally to further evaluate.   Plan -Bilateral axillary ultrasound

## 2023-01-21 NOTE — Assessment & Plan Note (Addendum)
BP 139/88 today.  Patient reports able to obtain BP cuff monitor for home.  Advised patient to record BP readings 2-3 times per week and provide log at next visit/call.  Patient hesitant on starting antihypertensive at this time but is open if BP remains elevated. Last year's BMP unremarkable with normal kidney function.

## 2023-01-21 NOTE — Progress Notes (Signed)
CC: Bilateral axillary mass  HPI:  [name] is a 37 y.o. female living with a history stated below and presents today for bilateral axillary mass. Please see problem based assessment and plan for additional details.  Past Medical History:  Diagnosis Date   Hypertension     Current Outpatient Medications on File Prior to Visit  Medication Sig Dispense Refill   clotrimazole (LOTRIMIN) 1 % cream Apply to affected area 2 times daily for 7 days. 15 g 0   ibuprofen (ADVIL) 800 MG tablet TAKE 1 TABLET BY MOUTH EVERY 8 HOURS AS NEEDED 30 tablet 5   No current facility-administered medications on file prior to visit.    Family History  Problem Relation Age of Onset   Hypertension Mother    Cancer Father     Social History   Socioeconomic History   Marital status: Single    Spouse name: Not on file   Number of children: Not on file   Years of education: Not on file   Highest education level: Not on file  Occupational History   Not on file  Tobacco Use   Smoking status: Never   Smokeless tobacco: Never  Substance and Sexual Activity   Alcohol use: Yes    Alcohol/week: 2.0 standard drinks of alcohol    Types: 2 Standard drinks or equivalent per week   Drug use: No   Sexual activity: Yes    Partners: Male    Birth control/protection: Surgical  Other Topics Concern   Not on file  Social History Narrative   Not on file   Social Drivers of Health   Financial Resource Strain: Not on file  Food Insecurity: No Food Insecurity (01/21/2023)   Hunger Vital Sign    Worried About Running Out of Food in the Last Year: Never true    Ran Out of Food in the Last Year: Never true  Transportation Needs: No Transportation Needs (01/21/2023)   PRAPARE - Administrator, Civil Service (Medical): No    Lack of Transportation (Non-Medical): No  Physical Activity: Not on file  Stress: Not on file  Social Connections: Moderately Integrated (12/15/2021)   Social  Connection and Isolation Panel [NHANES]    Frequency of Communication with Friends and Family: Three times a week    Frequency of Social Gatherings with Friends and Family: Twice a week    Attends Religious Services: More than 4 times per year    Active Member of [employer] or Organizations: No    Attends Banker Meetings: Never    Marital Status: Living with partner  Intimate Partner Violence: Not At Risk (01/21/2023)   Humiliation, Afraid, Rape, and Kick questionnaire    Fear of Current or Ex-Partner: No    Emotionally Abused: No    Physically Abused: No    Sexually Abused: No    Review of Systems: ROS negative except for what is noted on the assessment and plan.  Vitals:   01/21/23 1008  BP: 139/88  Pulse: 85  SpO2: 100%  Weight: (!) 354 lb 3.2 oz (160.7 kg)  Height: 5\' 7"  (1.702 m)   Physical Exam: Constitutional: well-appearing female sitting in chair, in no acute distress Cardiovascular: regular rate  Pulmonary/Chest: normal work of breathing on room air Neurological: alert & oriented x 3 Psych: Normal mood and affect MSK: Breast exam done with chaperone present, pea-sized mobile lesion palpated at right axillary, no masses palpated over bilateral breasts or left axillary  GU: chaperone present, no lesion/rash on external exam, unable to fully visualize cervix with largest speculum available in our office after attempts, no discharge noted  Assessment & Plan:   Class 3 severe obesity with body mass index (BMI) of 50.0 to 59.9 in adult [facility]) Body mass index is 55.48 kg/m. Last year's A1c normal.  Will recheck A1c to screen for diabetes today. Continue to encourage healthy eating habits and physical activity.   Microcytic anemia Microcytic anemia.  Endorses heavy menstrual bleeding.  Reports regular cycles that last 7 days with 5-6 days of heavy bleeding that requires about 6 pads a day.  Asymptomatic today.  Evaluate for IDA.  Plan -CBC and iron  panel  Health care maintenance Attempted Pap smear today but unsuccessful to fully visualize cervix after multiple attempts with available speculum in clinic. Last pap smear in 2016 was normal. Patient denies any abnormal pap smears. Patient agrees to referral to OBGYN today.   Primary hypertension BP 139/88 today.  Patient reports able to obtain BP cuff monitor for home.  Advised patient to record BP readings 2-3 times per week and provide log at next visit/call.  Patient hesitant on starting antihypertensive at this time but is open if BP remains elevated. Last year's BMP unremarkable with normal kidney function.   Axillary mass, bilateral Reports noticing bilateral axillary masses about 1 year ago.  States masses have not grown in size and are stable.  Not tender to touch but irritable from patient's perspective since she notices them. Denies any breast tenderness or new masses/lesions. No history of prior mammogram. Denies any B symptoms. Denies any history of cancer or family history of cancer. Breast exam with chaperone present showed small pea sized mobile lesion under right axillary but no discrete masses over breasts or left axillary. No obvious rashes/skin lesions under armpits.   Order breast/axillary ultrasound bilaterally to further evaluate.   Plan -Bilateral axillary ultrasound   Patient discussed with Dr. Halina Andreas, D.O. [facility] Internal Medicine, PGY-2 Phone: [phone] Date 01/21/2023 Time 1:49 PM

## 2023-01-21 NOTE — Assessment & Plan Note (Signed)
Attempted Pap smear today but unsuccessful to fully visualize cervix after multiple attempts with available speculum in clinic. Last pap smear in 2016 was normal. Patient denies any abnormal pap smears. Patient agrees to referral to OBGYN today.

## 2023-01-21 NOTE — Assessment & Plan Note (Addendum)
Body mass index is 55.48 kg/m. Last year's A1c normal.  Will recheck A1c to screen for diabetes today. Continue to encourage healthy eating habits and physical activity.

## 2023-01-21 NOTE — Patient Instructions (Addendum)
Thank you, [name] for allowing [place] to provide your care today. Today we discussed your health today.  -Order breast and axillary ultrasound to evaluate for the lumps under armpits. Will call when results come back.  -Blood work today to check iron levels and screen for diabetes -Measure your blood pressure at home with cuff monitor 2-3 times a week and record them to discuss at telephone call or office visit   -Referral to OBGYN for future pap smears if you prefer  I have ordered the following labs for you:  Lab Orders         Iron, TIBC and Ferritin Panel         Hemoglobin A1c       Referrals ordered today:   Referral Orders         Ambulatory referral to Obstetrics / Gynecology       I have ordered the following medication/changed the following medications:   Stop the following medications: There are no discontinued medications.   Start the following medications: No orders of the defined types were placed in this encounter.    Follow up: 2-3 months   Should you have any questions or concerns please call the internal medicine clinic at [phone].    Rana Snare, D.O. [facility]

## 2023-01-21 NOTE — Telephone Encounter (Signed)
Patient was contacted with both the  mammogram and U/S breast appointment 03-02-2023 at the breast center and aware that appointment is being mailed to her.

## 2023-01-21 NOTE — Assessment & Plan Note (Signed)
Microcytic anemia.  Endorses heavy menstrual bleeding.  Reports regular cycles that last 7 days with 5-6 days of heavy bleeding that requires about 6 pads a day.  Asymptomatic today.  Evaluate for IDA.  Plan -CBC and iron panel

## 2023-01-22 LAB — IRON,TIBC AND FERRITIN PANEL
Ferritin: 35 ng/mL (ref 15–150)
Iron Saturation: 7 % — CL (ref 15–55)
Iron: 23 ug/dL — ABNORMAL LOW (ref 27–159)
Total Iron Binding Capacity: 341 ug/dL (ref 250–450)
UIBC: 318 ug/dL (ref 131–425)

## 2023-01-22 LAB — CBC
Hematocrit: 31.4 % — ABNORMAL LOW (ref 34.0–46.6)
Hemoglobin: 9.1 g/dL — ABNORMAL LOW (ref 11.1–15.9)
MCH: 20.5 pg — ABNORMAL LOW (ref 26.6–33.0)
MCHC: 29 g/dL — ABNORMAL LOW (ref 31.5–35.7)
MCV: 71 fL — ABNORMAL LOW (ref 79–97)
Platelets: 297 10*3/uL (ref 150–450)
RBC: 4.43 x10E6/uL (ref 3.77–5.28)
RDW: 17 % — ABNORMAL HIGH (ref 11.7–15.4)
WBC: 7.8 10*3/uL (ref 3.4–10.8)

## 2023-01-22 LAB — HEMOGLOBIN A1C
Est. average glucose Bld gHb Est-mCnc: 117 mg/dL
Hgb A1c MFr Bld: 5.7 % — ABNORMAL HIGH (ref 4.8–5.6)

## 2023-01-22 NOTE — Progress Notes (Signed)
Internal Medicine Clinic Attending  Case discussed with the resident at the time of the visit.  We reviewed the resident's history and exam and pertinent patient test results.  I agree with the assessment, diagnosis, and plan of care documented in the resident's note.  

## 2023-02-10 ENCOUNTER — Encounter: Payer: Self-pay | Admitting: Student

## 2023-02-10 MED ORDER — FERROUS SULFATE 325 (65 FE) MG PO TABS: 325.0000 mg | ORAL_TABLET | ORAL | 3 refills | Status: AC

## 2023-03-02 ENCOUNTER — Ambulatory Visit
Admission: RE | Admit: 2023-03-02 | Discharge: 2023-03-02 | Disposition: A | Discharge status: Home / Self Care | Payer: 59 | Source: Ambulatory Visit | Attending: Internal Medicine | Admitting: Internal Medicine

## 2023-03-02 ENCOUNTER — Other Ambulatory Visit: Payer: Self-pay | Admitting: Internal Medicine

## 2023-03-02 DIAGNOSIS — D509 Iron deficiency anemia, unspecified: Secondary | ICD-10-CM

## 2023-03-02 DIAGNOSIS — R2233 Localized swelling, mass and lump, upper limb, bilateral: Secondary | ICD-10-CM

## 2023-03-02 DIAGNOSIS — N6310 Unspecified lump in the right breast, unspecified quadrant: Secondary | ICD-10-CM

## 2023-03-02 DIAGNOSIS — R928 Other abnormal and inconclusive findings on diagnostic imaging of breast: Secondary | ICD-10-CM

## 2023-03-02 DIAGNOSIS — Z862 Personal history of diseases of the blood and blood-forming organs and certain disorders involving the immune mechanism: Secondary | ICD-10-CM

## 2023-03-02 DIAGNOSIS — N6315 Unspecified lump in the right breast, overlapping quadrants: Secondary | ICD-10-CM | POA: Diagnosis not present

## 2023-03-02 DIAGNOSIS — Z124 Encounter for screening for malignant neoplasm of cervix: Secondary | ICD-10-CM

## 2023-03-02 DIAGNOSIS — Z131 Encounter for screening for diabetes mellitus: Secondary | ICD-10-CM

## 2023-03-02 DIAGNOSIS — M7989 Other specified soft tissue disorders: Secondary | ICD-10-CM | POA: Diagnosis not present

## 2023-03-02 DIAGNOSIS — I10 Essential (primary) hypertension: Secondary | ICD-10-CM

## 2023-03-02 DIAGNOSIS — Z0000 Encounter for general adult medical examination without abnormal findings: Secondary | ICD-10-CM

## 2023-03-24 ENCOUNTER — Other Ambulatory Visit: Payer: Self-pay

## 2023-03-24 ENCOUNTER — Encounter: Payer: Self-pay | Admitting: Internal Medicine

## 2023-03-24 ENCOUNTER — Ambulatory Visit (INDEPENDENT_AMBULATORY_CARE_PROVIDER_SITE_OTHER): Payer: 59 | Admitting: Internal Medicine

## 2023-03-24 VITALS — BP 134/74 | HR 83 | Temp 97.8°F | Ht 66.0 in | Wt 353.3 lb

## 2023-03-24 DIAGNOSIS — Z0000 Encounter for general adult medical examination without abnormal findings: Secondary | ICD-10-CM

## 2023-03-24 DIAGNOSIS — R2233 Localized swelling, mass and lump, upper limb, bilateral: Secondary | ICD-10-CM

## 2023-03-24 DIAGNOSIS — Z1159 Encounter for screening for other viral diseases: Secondary | ICD-10-CM

## 2023-03-24 DIAGNOSIS — I10 Essential (primary) hypertension: Secondary | ICD-10-CM

## 2023-03-24 MED ORDER — AMLODIPINE BESYLATE 5 MG PO TABS: 5.0000 mg | ORAL_TABLET | Freq: Every day | ORAL | 11 refills | Status: AC

## 2023-03-24 NOTE — Progress Notes (Signed)
CC: Htn and axillary pain  HPI:  [name] is a 38 y.o. female living with a history stated below and presents today for a follow up of HTN and axillary pain . Please see problem based assessment and plan for additional details.  Past Medical History:  Diagnosis Date   Hypertension    Left shoulder pain 12/15/2021    Current Outpatient Medications on File Prior to Visit  Medication Sig Dispense Refill   clotrimazole (LOTRIMIN) 1 % cream Apply to affected area 2 times daily for 7 days. 15 g 0   ferrous sulfate 325 (65 FE) MG tablet Take 1 tablet (325 mg total) by mouth every other day. 45 tablet 3   ibuprofen (ADVIL) 800 MG tablet TAKE 1 TABLET BY MOUTH EVERY 8 HOURS AS NEEDED 30 tablet 5   No current facility-administered medications on file prior to visit.    Family History  Problem Relation Age of Onset   Hypertension Mother    Cancer Father     Social History   Socioeconomic History   Marital status: Single    Spouse name: Not on file   Number of children: Not on file   Years of education: Not on file   Highest education level: Not on file  Occupational History   Not on file  Tobacco Use   Smoking status: Never   Smokeless tobacco: Never  Substance and Sexual Activity   Alcohol use: Yes    Alcohol/week: 2.0 standard drinks of alcohol    Types: 2 Standard drinks or equivalent per week   Drug use: No   Sexual activity: Yes    Partners: Male    Birth control/protection: Surgical  Other Topics Concern   Not on file  Social History Narrative   Not on file   Social Drivers of Health   Financial Resource Strain: Not on file  Food Insecurity: No Food Insecurity (01/21/2023)   Hunger Vital Sign    Worried About Running Out of Food in the Last Year: Never true    Ran Out of Food in the Last Year: Never true  Transportation Needs: No Transportation Needs (01/21/2023)   PRAPARE - Administrator, Civil Service (Medical): No    Lack of  Transportation (Non-Medical): No  Physical Activity: Not on file  Stress: Not on file  Social Connections: Moderately Integrated (12/15/2021)   Social Connection and Isolation Panel [NHANES]    Frequency of Communication with Friends and Family: Three times a week    Frequency of Social Gatherings with Friends and Family: Twice a week    Attends Religious Services: More than 4 times per year    Active Member of [employer] or Organizations: No    Attends Banker Meetings: Never    Marital Status: Living with partner  Intimate Partner Violence: Not At Risk (01/21/2023)   Humiliation, Afraid, Rape, and Kick questionnaire    Fear of Current or Ex-Partner: No    Emotionally Abused: No    Physically Abused: No    Sexually Abused: No    Review of Systems: ROS negative except for what is noted on the assessment and plan.  Vitals:   03/24/23 0953  BP: 134/74  Pulse: 83  Temp: 97.8 F (36.6 C)  TempSrc: Oral  SpO2: 100%  Weight: (!) 353 lb 4.8 oz (160.3 kg)  Height: 5\' 6"  (1.676 m)    Physical Exam: Constitutional: well appearing, obese female in no acute distress Cardiovascular:  regular rate and rhythm Pulmonary: normal work of breathing on room air MSK: normal bulk and tone, no palpable mass in left axillary region  Neurological: alert & oriented x 3, no focal deficit Skin: warm and dry, no evidence of hidradenitis in the axillary region Psych: normal mood and behavior  Assessment & Plan:    Patient discussed with Dr. Oswaldo Done  Primary hypertension BP 134/74 today. Her SBP has been in the 130s for the past few years while coming to the [facility], consistent with a diagnosis of hypertension. She states that HTN runs in her family, with her parents and son having a diagnosis of HTN.  Plan: - Start amlodipine 5 mg daily - BMP today - Follow up in 4 weeks  Axillary mass, bilateral The patient has had bilateral (L>R) axillary pain for about 1 year. She had an ultrasound  done this year that showed a likely benign 2.7 cm mass in the outer RIGHT breast at 9 o'clock 10 cm from the nipple, likely a fibroadenoma. No mammographic evidence of malignancy involving the LEFT breast and no pathologic lymphadenopathy was seen in either axilla. On exam today, her skin around the axillary region is normal and does not appear to be consistent with hidradenitis (no skin scarring or tunneling, no drainage/pus, no thickened skin). She does not have any pain in this region, unless she applies pressure to it.   Plan: - Repeat Ultrasound in 6 months   Health care maintenance - Declined flu shot - HCV testing   Roseland Braun, D.O. [facility] Internal Medicine, PGY-3 Phone: [phone] Date 03/24/2023 Time 3:36 PM

## 2023-03-24 NOTE — Patient Instructions (Signed)
Thank you, Wendy Mayer for allowing [place] to provide your care today. Today we discussed:  High blood pressure Start taking amlodipine 5 mg daily  Follow up in 4 weeks Axillary tenderness We will need to get an ultrasound of your right breast/axilla around July  If your left axilla skin starts to change, if there is drainage, or more pain, please call us and let us know  I have ordered the following labs for you:   Lab Orders         BMP8+Anion Gap         Hepatitis C Ab reflex to Quant PCR       Referrals ordered today:   Referral Orders  No referral(s) requested today     I have ordered the following medication/changed the following medications:   Stop the following medications: There are no discontinued medications.   Start the following medications: Meds ordered this encounter  Medications   amLODipine (NORVASC) 5 MG tablet    Sig: Take 1 tablet (5 mg total) by mouth daily.    Dispense:  30 tablet    Refill:  11     Follow up:  4 weeks     Should you have any questions or concerns please call the internal medicine clinic at [phone].     Elza Rafter, D.O. [facility]

## 2023-03-24 NOTE — Assessment & Plan Note (Signed)
BP 134/74 today. Her SBP has been in the 130s for the past few years while coming to the [facility], consistent with a diagnosis of hypertension. She states that HTN runs in her family, with her parents and son having a diagnosis of HTN.  Plan: - Start amlodipine 5 mg daily - BMP today - Follow up in 4 weeks

## 2023-03-24 NOTE — Assessment & Plan Note (Addendum)
The patient has had bilateral (L>R) axillary pain for about 1 year. She had an ultrasound done this year that showed a likely benign 2.7 cm mass in the outer RIGHT breast at 9 o'clock 10 cm from the nipple, likely a fibroadenoma. No mammographic evidence of malignancy involving the LEFT breast and no pathologic lymphadenopathy was seen in either axilla. On exam today, her skin around the axillary region is normal and does not appear to be consistent with hidradenitis (no skin scarring or tunneling, no drainage/pus, no thickened skin). She does not have any pain in this region, unless she applies pressure to it.   Plan: - Repeat Ultrasound in 6 months

## 2023-03-24 NOTE — Assessment & Plan Note (Addendum)
-   Declined flu shot - HCV testing

## 2023-03-25 LAB — BMP8+ANION GAP
Anion Gap: 10 mmol/L (ref 10.0–18.0)
BUN/Creatinine Ratio: 18 (ref 9–23)
BUN: 12 mg/dL (ref 6–20)
CO2: 24 mmol/L (ref 20–29)
Calcium: 9 mg/dL (ref 8.7–10.2)
Chloride: 105 mmol/L (ref 96–106)
Creatinine, Ser: 0.67 mg/dL (ref 0.57–1.00)
Glucose: 82 mg/dL (ref 70–99)
Potassium: 4.9 mmol/L (ref 3.5–5.2)
Sodium: 139 mmol/L (ref 134–144)
eGFR: 114 mL/min/{1.73_m2} (ref >59)

## 2023-03-25 LAB — HCV INTERPRETATION

## 2023-03-25 LAB — HCV AB W REFLEX TO QUANT PCR: HCV Ab: NONREACTIVE

## 2023-03-25 NOTE — Progress Notes (Signed)
BMP unremarkable and HCV negative.

## 2023-03-26 NOTE — Progress Notes (Signed)
 Internal Medicine Clinic Attending  Case discussed with the resident physician at the time of the visit.  We reviewed the patient's history, exam, and pertinent patient test results.  I agree with the assessment, diagnosis, and plan of care documented in the resident's note.

## 2023-04-08 ENCOUNTER — Encounter: Payer: 59 | Admitting: Family Medicine

## 2023-04-08 DIAGNOSIS — Z124 Encounter for screening for malignant neoplasm of cervix: Secondary | ICD-10-CM

## 2023-04-08 DIAGNOSIS — Z01419 Encounter for gynecological examination (general) (routine) without abnormal findings: Secondary | ICD-10-CM

## 2023-04-08 DIAGNOSIS — Z113 Encounter for screening for infections with a predominantly sexual mode of transmission: Secondary | ICD-10-CM
